# Patient Record
Sex: Male | Born: 1975 | Race: Black or African American | Hispanic: No | Marital: Single | Smoking: Current every day smoker
Health system: Southern US, Community
[De-identification: ages and names within clinical notes are randomized; demographics above are authoritative.]

## PROBLEM LIST (undated history)

## (undated) DIAGNOSIS — M6780 Other specified disorders of synovium and tendon, unspecified site: Secondary | ICD-10-CM

## (undated) DIAGNOSIS — M65811 Other synovitis and tenosynovitis, right shoulder: Secondary | ICD-10-CM

## (undated) DIAGNOSIS — M67911 Unspecified disorder of synovium and tendon, right shoulder: Secondary | ICD-10-CM

## (undated) DIAGNOSIS — J189 Pneumonia, unspecified organism: Secondary | ICD-10-CM

## (undated) HISTORY — PX: APPENDECTOMY: SHX54

---

## 2008-07-15 ENCOUNTER — Emergency Department (HOSPITAL_COMMUNITY): Admission: EM | Admit: 2008-07-15 | Discharge: 2008-07-15 | Payer: Self-pay | Admitting: Emergency Medicine

## 2008-07-16 ENCOUNTER — Ambulatory Visit (HOSPITAL_COMMUNITY): Admission: RE | Admit: 2008-07-16 | Discharge: 2008-07-16 | Payer: Self-pay | Admitting: Emergency Medicine

## 2008-07-17 ENCOUNTER — Emergency Department (HOSPITAL_COMMUNITY): Admission: EM | Admit: 2008-07-17 | Discharge: 2008-07-17 | Payer: Self-pay | Admitting: Emergency Medicine

## 2011-09-12 ENCOUNTER — Emergency Department (INDEPENDENT_AMBULATORY_CARE_PROVIDER_SITE_OTHER): Payer: BC Managed Care – PPO

## 2011-09-12 ENCOUNTER — Emergency Department (INDEPENDENT_AMBULATORY_CARE_PROVIDER_SITE_OTHER)
Admission: EM | Admit: 2011-09-12 | Discharge: 2011-09-12 | Disposition: A | Discharge status: Home / Self Care | Payer: BC Managed Care – PPO | Source: Home / Self Care | Attending: Family Medicine | Admitting: Family Medicine

## 2011-09-12 ENCOUNTER — Encounter (HOSPITAL_COMMUNITY): Payer: Self-pay | Admitting: Emergency Medicine

## 2011-09-12 DIAGNOSIS — M461 Sacroiliitis, not elsewhere classified: Secondary | ICD-10-CM

## 2011-09-12 MED ORDER — PREDNISONE (PAK) 10 MG PO TABS: ORAL_TABLET | ORAL | Status: AC

## 2011-09-12 MED ORDER — HYDROCODONE-ACETAMINOPHEN 5-325 MG PO TABS: ORAL_TABLET | ORAL | Status: AC

## 2011-09-12 NOTE — ED Provider Notes (Signed)
History     CSN: [id number]  Arrival date & time 09/12/11  1530   First MD Initiated Contact with Patient 09/12/11 1605      Chief Complaint  Patient presents with  . Leg Injury  . Hip Pain  . Fall    (Consider location/radiation/quality/duration/timing/severity/associated sxs/prior treatment) HPI Comments: Shawn Todd presents for evaluation of right hip pain. He reports that he stumbled and fell, over a curb about 8 days ago. He reports that he was walking backwards, talking with people. He did not realize the curb was there. He turned and fell onto his right hip and leg. He has been able to ambulate. Since that time he reports intermittent, sharp pains in his right lower back and right lower leg. 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  Patient is a 36 y.o. male presenting with hip pain. The history is provided by the patient.  Hip Pain This is a new problem. The current episode started more than 1 week ago. The problem occurs constantly. The problem has not changed since onset.The symptoms are aggravated by walking. The treatment provided mild relief.    History reviewed. No pertinent past medical history.  Past Surgical History  Procedure Date  . Appendectomy     No family history on file.  History  Substance Use Topics  . Smoking status: Current Everyday Smoker  . Smokeless tobacco: Not on file  . Alcohol Use: Yes      Review of Systems  Constitutional: Negative.   HENT: Negative.   Eyes: Negative.   Respiratory: Negative.   Cardiovascular: Negative.   Gastrointestinal: Negative.   Genitourinary: Negative.   Musculoskeletal: Negative.   Skin: Negative.   Neurological: Negative.     Allergies  Review of patient's allergies indicates no known allergies.  Home Medications   Current Outpatient Rx  Name Route Sig Dispense Refill  . HYDROCODONE-ACETAMINOPHEN 5-325 MG PO  TABS  Take one to two tablets every 4 to 6 hours as needed for pain 20 tablet 0  . PREDNISONE (PAK) 10 MG PO TABS  Take 6 tablets on day 1, 5 tablets on day 2, 4 tablets on day 3, 3 tablets on day 4, 2 tablets on day 5, 1 tablet on day 6 21 tablet 0    BP 130/91  Pulse 58  Temp(Src) 99.2 F (37.3 C) (Oral)  Resp 14  SpO2 100%  Physical Exam  Nursing note and vitals reviewed. Constitutional: He is oriented to person, place, and time. He appears well-developed and well-nourished.  HENT:  Head: Normocephalic and atraumatic.  Eyes: EOM are normal.  Neck: Normal range of motion.  Pulmonary/Chest: Effort normal.  Musculoskeletal: Normal range of motion.       RIGHT hip: full flexion, extension, abduction, adduction against resistance; 5/5 strength; no pain or difficulty with internal or external rotation of hip;   Back: positive straight leg raise RIGHT; negative straight leg on LEFT, positive Fabers on RIGHT, negative Fabers on LEFT; no pain with internal or external rotation bilaterally, full flexion, extension, abduction, and adduction; 5/5 strength  Neurological: He is alert and oriented to person, place, and time.  Skin: Skin is warm and dry.  Psychiatric: His behavior is normal.    ED Course  Procedures (including critical care time)  Labs Reviewed - No data to display Dg Pelvis 1-2 Views  09/12/2011  *RADIOLOGY REPORT*  Clinical Data: Injury.  Hip  pain.  Fall  PELVIS - 1-2 VIEW  Comparison: None.  Findings: There is no evidence of fracture or dislocation.  There is no evidence of arthropathy or other focal bone abnormality. Soft tissues are unremarkable.  IMPRESSION: Negative exam.  Original Report Authenticated By: Rosealee Albee, M.D.   Dg Tibia/fibula Right  09/12/2011  *RADIOLOGY REPORT*  Clinical Data: History of fall complaining of pain in the proximal fibula.  RIGHT TIBIA AND FIBULA - 2 VIEW  Comparison: No priors.  Findings: Multiple views of the right leg demonstrate  mild expansion and trabecular irregularity of the proximal right fibula. No definite acute fractures are identified in the fibula or tibia. Overlying soft tissues are unremarkable.  IMPRESSION: 1.  No acute fracture. 2.  Appearance of proximal fibula is suggestive of either of the post-traumatic remodelling from remote proximal fibular fracture, or a benign process such as fibrous dysplasia.  Original Report Authenticated By: Florencia Reasons, M.D.     1. Sacroiliitis       MDM  Xray's reviewed by radiologist and myself; rx given for prednisone dosepak and hydrocodone PRN        Shawn Munda, MD 09/12/11 [phone]

## 2011-09-12 NOTE — ED Notes (Signed)
PT HERE WITH RIGHT HIP PAIN SHOOTING DOWN TO R MEDIAL LEG INTERMITT WITH CERTAIN MOVEMENT OR POSITION S/P INJURY LAST WEEK.PT STATES HE WAS NOT LOOKING AND TRIPPED OVER CURB WHEN HE FELL ON HIP AND LEG.DENIES BRUISING.ABLE TO AMBULATE WITHOUT DIFF BUT EFFECTING ACTIVITIES.PT TRIED ICY HOT AND ICE/HEAT BUT NO RELIEF

## 2011-09-12 NOTE — Discharge Instructions (Signed)
Take medications as directed. Use mild heat (heating pad, warm baths, etc) for 10 to 15 minutes, two to three times daily, as needed and as tolerated, taking care to not burn the skin. Begin stretches and exercises, as instructed in handouts, after 48 hours. Return to care should your symptoms not improve, or worsen in any way, such as numbness, weakness, or tingling, or inability to control urine or bowel movements.   

## 2011-10-04 ENCOUNTER — Other Ambulatory Visit: Payer: Self-pay | Admitting: Orthopedic Surgery

## 2011-10-04 DIAGNOSIS — M25511 Pain in right shoulder: Secondary | ICD-10-CM

## 2011-10-04 DIAGNOSIS — S43439A Superior glenoid labrum lesion of unspecified shoulder, initial encounter: Secondary | ICD-10-CM

## 2011-10-11 ENCOUNTER — Other Ambulatory Visit: Payer: Self-pay | Admitting: Orthopedic Surgery

## 2011-10-11 DIAGNOSIS — Z139 Encounter for screening, unspecified: Secondary | ICD-10-CM

## 2011-10-13 ENCOUNTER — Inpatient Hospital Stay: Admission: RE | Admit: 2011-10-13 | Payer: BC Managed Care – PPO | Source: Ambulatory Visit

## 2011-10-13 ENCOUNTER — Other Ambulatory Visit: Payer: BC Managed Care – PPO

## 2011-10-27 ENCOUNTER — Ambulatory Visit
Admission: RE | Admit: 2011-10-27 | Discharge: 2011-10-27 | Disposition: A | Discharge status: Home / Self Care | Payer: BC Managed Care – PPO | Source: Ambulatory Visit | Attending: Orthopedic Surgery | Admitting: Orthopedic Surgery

## 2011-10-27 DIAGNOSIS — S43439A Superior glenoid labrum lesion of unspecified shoulder, initial encounter: Secondary | ICD-10-CM

## 2011-10-27 DIAGNOSIS — M25511 Pain in right shoulder: Secondary | ICD-10-CM

## 2011-10-27 DIAGNOSIS — Z139 Encounter for screening, unspecified: Secondary | ICD-10-CM

## 2011-10-27 MED ORDER — IOHEXOL 180 MG/ML  SOLN
15.0000 mL | Freq: Once | INTRAMUSCULAR | Status: AC | PRN
  Administered 2011-10-27: 15 mL via INTRA_ARTICULAR

## 2012-06-07 ENCOUNTER — Encounter (HOSPITAL_COMMUNITY): Payer: Self-pay | Admitting: Emergency Medicine

## 2012-06-07 ENCOUNTER — Emergency Department (INDEPENDENT_AMBULATORY_CARE_PROVIDER_SITE_OTHER)
Admission: EM | Admit: 2012-06-07 | Discharge: 2012-06-07 | Disposition: A | Discharge status: Home / Self Care | Payer: BC Managed Care – PPO | Source: Home / Self Care | Attending: Family Medicine | Admitting: Family Medicine

## 2012-06-07 DIAGNOSIS — M25569 Pain in unspecified knee: Secondary | ICD-10-CM

## 2012-06-07 DIAGNOSIS — M25561 Pain in right knee: Secondary | ICD-10-CM

## 2012-06-07 MED ORDER — IBUPROFEN 800 MG PO TABS: 800.0000 mg | ORAL_TABLET | Freq: Three times a day (TID) | ORAL | Status: DC | PRN

## 2012-06-07 NOTE — ED Provider Notes (Signed)
Medical screening examination/treatment/procedure(s) were performed by resident physician or non-physician practitioner and as supervising physician I was immediately available for consultation/collaboration.   Barkley Bruns MD.    Linna Hoff, MD 06/07/12 [phone]

## 2012-06-07 NOTE — ED Provider Notes (Signed)
History     CSN: [id number]  Arrival date & time 06/07/12  1540   First MD Initiated Contact with Patient 06/07/12 1656      Chief Complaint  Patient presents with  . Knee Pain    (Consider location/radiation/quality/duration/timing/severity/associated sxs/prior treatment) Patient is a 36 y.o. male presenting with knee pain. The history is provided by the patient.  Knee Pain This is a new problem. The current episode started 3 to 5 hours ago. The problem occurs constantly. The problem has not changed since onset.The symptoms are aggravated by walking, bending and twisting. The symptoms are relieved by rest. He has tried nothing for the symptoms. The treatment provided mild relief.    History reviewed. No pertinent past medical history.  Past Surgical History  Procedure Date  . Appendectomy     No family history on file.  History  Substance Use Topics  . Smoking status: Current Every Day Smoker  . Smokeless tobacco: Not on file  . Alcohol Use: Yes      Review of Systems  Musculoskeletal: Positive for arthralgias.  All other systems reviewed and are negative.    Allergies  Review of patient's allergies indicates no known allergies.  Home Medications   Current Outpatient Rx  Name  Route  Sig  Dispense  Refill  . IBUPROFEN 800 MG PO TABS   Oral   Take 1 tablet (800 mg total) by mouth every 8 (eight) hours as needed for pain.   30 tablet   0     BP 138/77  Pulse 74  Temp 97.7 F (36.5 C) (Oral)  Resp 18  SpO2 96%  Physical Exam  Nursing note and vitals reviewed. Constitutional: He is oriented to person, place, and time. Vital signs are normal. He appears well-developed and well-nourished. He is active and cooperative.  HENT:  Head: Normocephalic.  Eyes: Conjunctivae normal are normal. Pupils are equal, round, and reactive to light. No scleral icterus.  Neck: Trachea normal. Neck supple.  Cardiovascular: Normal rate, regular rhythm, normal heart  sounds and intact distal pulses.   Pulmonary/Chest: Effort normal and breath sounds normal.  Musculoskeletal: Normal range of motion.       Right knee: Normal.       Left knee: Normal.       Right ankle: Normal. Achilles tendon normal.       Legs: Neurological: He is alert and oriented to person, place, and time. No cranial nerve deficit or sensory deficit.  Skin: Skin is warm and dry.  Psychiatric: He has a normal mood and affect. His speech is normal and behavior is normal. Judgment and thought content normal. Cognition and memory are normal.    ED Course  Procedures (including critical care time)  Labs Reviewed - No data to display No results found.   1. Right knee pain       MDM  Ice applied today, then switch to heat.  Knee sleeve, nsaids.        Johnsie Kindred, NP 06/07/12 1704

## 2012-06-07 NOTE — ED Notes (Addendum)
Pt c/o right knee pain since... Was getting inside a car when he felt his right knee "pop"... Pain is to medial of knee, hurts to walk and bend knee... Denies: swelling or inj/truama to site... He is alert w/no signs of acute distress and an unsteady gait.

## 2013-12-12 ENCOUNTER — Ambulatory Visit: Payer: Self-pay

## 2014-02-28 ENCOUNTER — Ambulatory Visit (INDEPENDENT_AMBULATORY_CARE_PROVIDER_SITE_OTHER): Payer: BC Managed Care – PPO | Admitting: Family Medicine

## 2014-02-28 VITALS — BP 98/62 | HR 60 | Temp 97.5°F | Resp 20 | Ht 74.0 in | Wt 157.0 lb

## 2014-02-28 DIAGNOSIS — Z5189 Encounter for other specified aftercare: Secondary | ICD-10-CM

## 2014-02-28 DIAGNOSIS — M25519 Pain in unspecified shoulder: Secondary | ICD-10-CM

## 2014-02-28 DIAGNOSIS — M25511 Pain in right shoulder: Secondary | ICD-10-CM

## 2014-02-28 DIAGNOSIS — S43431D Superior glenoid labrum lesion of right shoulder, subsequent encounter: Secondary | ICD-10-CM

## 2014-02-28 NOTE — Patient Instructions (Signed)
Follow up with orthopaedic doctor if symptoms return. Should not lift if pain returns- see letter.  Return to the clinic or go to the nearest emergency room if any of your symptoms worsen or new symptoms occur.

## 2014-02-28 NOTE — Progress Notes (Addendum)
Subjective:  This chart was scribed for Shade Flood, MD by Elveria Rising, Medial Scribe. This patient was seen in room 13 and the patient's care was started at 3:26 PM.    Patient ID: Shawn Todd, male    DOB: 12/09/1975, 38 y.o.   MRN: [id number]  HPI HPI Comments: Shawn Todd is a 38 y.o. male who presents to the [facility] complaining of right shoulder pain. Patient states that he was recommenced for rotator cuff surgery for his right shoulder. MRI of right shoulder in 05/21013: slap lesion and shallow surface tear supraspinatus. He was hesitant about surgery and never followed up for consultation with orthopedist [name]. Patient reports history of bilateral shoulder pain, with greater pain in his right.  Patient reports right shoulder injury two weeks ago while playing basketball. Injury sustained when landing on kid's shoulder on his right axilla when coming down for a rebound. This event aggravated his right shoulder pain. Patient has since been out of work for these last two weeks. He denies evaluation for his shoulder pain during his hiatus. Patient states that he needs verified note to return to work. Patient works at [employer].   Patient reports chronic, baseline level pain before the injury. He reports that he is quite active and he was able to perform his job with tolerable difficulty before the injury. He states that his shoulder pain has returned to typical levels and feels that he is ready to return to work. Patient requests documentation to allow his return to work.   There are no active problems to display for this patient.  History reviewed. No pertinent past medical history. Past Surgical History  Procedure Laterality Date  . Appendectomy     No Known Allergies Prior to Admission medications   Not on File   History   Social History  . Marital Status: Single    Spouse Name: N/A    Number of Children: N/A  . Years of Education: N/A    Occupational History  . Not on file.   Social History Main Topics  . Smoking status: Current Every Day Smoker  . Smokeless tobacco: Never Used  . Alcohol Use: Yes  . Drug Use: No  . Sexual Activity: Not on file   Other Topics Concern  . Not on file   Social History Narrative  . No narrative on file    Review of Systems  Constitutional: Negative for fever and chills.  Musculoskeletal: Positive for arthralgias.  Neurological: Negative for weakness and numbness.       Objective:   Physical Exam  Nursing note and vitals reviewed. Constitutional: He is oriented to person, place, and time. He appears well-developed and well-nourished.  HENT:  Head: Normocephalic and atraumatic.  Eyes: EOM are normal.  Neck: Neck supple.  Cardiovascular: Normal rate.   Pulmonary/Chest: Effort normal.  Musculoskeletal: Normal range of motion.  Right shoulder: Full ROM. Full rotator cuff strength. Negative empty can. Negative lift off. Negative neer. Negative Hawkin's. Positive O'Brien's. [place] and AC joints are nontender.   Neurological: He is alert and oriented to person, place, and time.  Skin: Skin is warm and dry.  Psychiatric: He has a normal mood and affect. His behavior is normal.     Filed Vitals:   02/28/14 1444  BP: 98/62  Pulse: 60  Temp: 97.5 F (36.4 C)  TempSrc: Oral  Resp: 20  Height:  (1.88 m)  Weight: 157 lb (71.215  kg)  SpO2: 100%        Assessment & Plan:  Shawn Todd is a 38 y.o. male SLAP (superior glenoid labrum lesion), right, subsequent encounter  Pain in joint, shoulder region, right  Hx of SLAP lesion and surface tear of supraspinatus, but as above had been working with this without difficulty.  Injury 2 weeks ago, but now subjectively back to baseline. Min discomfort with obriens, but full ROM and RTC strength. Discussed follow up with ortho, but as full strength on exam and back to baseline - note given to RTW. If any recurrence of pain with  work - advised employer and to seek care here or ortho.   No orders of the defined types were placed in this encounter.   Patient Instructions  Follow up with orthopaedic doctor if symptoms return. Should not lift if pain returns- see letter.  Return to the clinic or go to the nearest emergency room if any of your symptoms worsen or new symptoms occur.

## 2014-08-31 ENCOUNTER — Ambulatory Visit (INDEPENDENT_AMBULATORY_CARE_PROVIDER_SITE_OTHER): Payer: BLUE CROSS/BLUE SHIELD

## 2014-08-31 ENCOUNTER — Ambulatory Visit (INDEPENDENT_AMBULATORY_CARE_PROVIDER_SITE_OTHER): Payer: BLUE CROSS/BLUE SHIELD | Admitting: Family Medicine

## 2014-08-31 VITALS — BP 100/62 | HR 72 | Temp 97.8°F | Resp 16 | Ht 75.0 in | Wt 157.2 lb

## 2014-08-31 DIAGNOSIS — M25512 Pain in left shoulder: Secondary | ICD-10-CM

## 2014-08-31 DIAGNOSIS — R202 Paresthesia of skin: Secondary | ICD-10-CM

## 2014-08-31 DIAGNOSIS — M542 Cervicalgia: Secondary | ICD-10-CM

## 2014-08-31 DIAGNOSIS — S46012A Strain of muscle(s) and tendon(s) of the rotator cuff of left shoulder, initial encounter: Secondary | ICD-10-CM | POA: Diagnosis not present

## 2014-08-31 DIAGNOSIS — R200 Anesthesia of skin: Secondary | ICD-10-CM

## 2014-08-31 MED ORDER — CYCLOBENZAPRINE HCL 5 MG PO TABS: 5.0000 mg | ORAL_TABLET | Freq: Three times a day (TID) | ORAL | Status: DC | PRN

## 2014-08-31 MED ORDER — OXYCODONE-ACETAMINOPHEN 5-325 MG PO TABS: 1.0000 | ORAL_TABLET | Freq: Three times a day (TID) | ORAL | Status: DC | PRN

## 2014-08-31 MED ORDER — MELOXICAM 7.5 MG PO TABS: 7.5000 mg | ORAL_TABLET | Freq: Two times a day (BID) | ORAL | Status: DC

## 2014-08-31 NOTE — Progress Notes (Addendum)
Chief Complaint:  Chief Complaint  Patient presents with  . Shoulder Pain    Left Shoulder, x 1 week    HPI: Shawn Todd is a 39 y.o. male who is here for 1 week history of left shoulder pain, he is unsure triggered it. 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  He picks up heavy items, 100 + lbs at work, he has numbness an tingling, no prior traumatic injury to the left shoulder. He has numbness and tingling if he sleeps on it nub all the away down, he has neck pain too. He has it in his trapezius. He is right hand dominant. He was laying on his shoulder when he first noticed sxs. He has no prior shoulder surgeries or injury.  He is a smoker. 7/10 pain, aching throbbing, night pain. He has been on advil pills without any relief. He has had problems with his right rotator cuff in the past and was needing surgery however he did not want to take off work for the surgery. He was being seen at that time by Shawn Todd. He is okay with going back to Shawn Todd and to be evaluated by Shawn Todd for his left shoulder. He has had hydrocodone/oxycodone in the past without any side effects. He denies any issues with substance abuse. He denies having an addictive personality. The patient is a smoker.  IMPRESSION:  1. Type 2 SLAP lesion. 2. Rotator interval synovitis. 3. Moderate supraspinatus tendinopathy/tendinosis. Shallow articular surface tear involving the supraspinatus tendon. 4. Intact biceps tendon, anterior and posterior labra.  History reviewed. No pertinent past medical history. Past Surgical History  Procedure Laterality Date  . Appendectomy     History   Social History  . Marital Status: Single    Spouse Name: N/A  . Number of Children: N/A  . Years of Education: N/A   Social History Main Topics  . Smoking status: Current Every Day Smoker  . Smokeless tobacco: Never Used  . Alcohol Use: Yes  . Drug Use: No  .  Sexual Activity: Not on file   Other Topics Concern  . None   Social History Narrative   Family History  Problem Relation Age of Onset  . Diabetes Father   . Heart disease Father   . Hypertension Father    No Known Allergies Prior to Admission medications   Not on File     ROS: The patient denies fevers, chills, night sweats, unintentional weight loss, chest pain, palpitations, wheezing, dyspnea on exertion, nausea, vomiting, abdominal pain, dysuria, hematuria, melena  All other systems have been reviewed and were otherwise negative with the exception of those mentioned in the HPI and as above.    PHYSICAL EXAM: Filed Vitals:   08/31/14 1115  BP: 100/62  Pulse: 72  Temp: 97.8 F (36.6 C)  Resp: 16   Filed Vitals:   08/31/14 1115  Height: 6\' 3"  (1.905 m)  Weight: 157 lb 3.2 oz (71.305 kg)   Body mass index is 19.65 kg/(m^2).  General: Alert, no acute distress HEENT:  Normocephalic, atraumatic, oropharynx patent. EOMI, PERRLA Cardiovascular:  Regular rate and rhythm, no rubs murmurs or gallops.  No Carotid bruits, radial pulse intact. No pedal edema.  Respiratory: Clear to auscultation bilaterally.  No wheezes, rales, or rhonchi.  No cyanosis, no use of accessory musculature GI: No organomegaly, abdomen is soft and non-tender, positive bowel sounds.  No masses.  Skin: No rashes. Neurologic: Facial musculature symmetric. Psychiatric: Patient is appropriate throughout our interaction. Lymphatic: No cervical lymphadenopathy Musculoskeletal: Gait intact. Decrease ROM, + weakness  Neck exam normal-neg spurling Shoulder No deformity, no hypertrophy/atrophy, no erythema, no fluid, no wounds Nontender at [facility] jt + Empty Can test, UNable to peform Lift off test, Speeds, Hawkins/Neers due to pain  4/5 strength,   LABS: No results found for this or any previous visit.   EKG/XRAY:   Primary read interpreted by Dr. Conley [facility] at [facility]. Neg for fracuter or  dislocation   ASSESSMENT/PLAN: Encounter Diagnoses  Name Primary?  . Neck pain   . Pain in joint, shoulder region, left   . Rotator cuff (capsule) sprain and strain, left, initial encounter Yes  . Numbness and tingling in left arm    Pleasant right hand dominant 39 year old gentleman, with past medical history of right shoulder rotator cuff injury who presents with left shoulder pain for the last 1 week. He has weakness on exam Patient was given a sling. This is for comfort. He was also prescribed Flexeril, Mobic, Percocet. For pain when necessary. Refer to Dr Shawn Todd for further evaluation. Work note given. Follow-up when necessary  Gross sideeffects, risk and benefits, and alternatives of medications d/w patient. Patient is aware that all medications have potential sideeffects and we are unable to predict every sideeffect or drug-drug interaction that may occur.  Shawn Vicars PHUONG, DO 08/31/2014 12:21 PM    09/09/14-came in for a work note extension. No appt with ortho yet. This referral has already been done, I have called the office myself and he has an appointment with Dr Shawn Pont on Friday March 18 at 8:30.

## 2014-08-31 NOTE — Progress Notes (Deleted)
      Chief Complaint:  Chief Complaint  Patient presents with  . Shoulder Pain    Left Shoulder, x 1 week    HPI: Shawn Todd is a 39 y.o. male who is here for  ***  History reviewed. No pertinent past medical history. Past Surgical History  Procedure Laterality Date  . Appendectomy     History   Social History  . Marital Status: Single    Spouse Name: N/A  . Number of Children: N/A  . Years of Education: N/A   Social History Main Topics  . Smoking status: Current Every Day Smoker  . Smokeless tobacco: Never Used  . Alcohol Use: Yes  . Drug Use: No  . Sexual Activity: Not on file   Other Topics Concern  . None   Social History Narrative   Family History  Problem Relation Age of Onset  . Diabetes Father   . Heart disease Father   . Hypertension Father    No Known Allergies Prior to Admission medications   Not on File     ROS: The patient denies fevers, chills, night sweats, unintentional weight loss, chest pain, palpitations, wheezing, dyspnea on exertion, nausea, vomiting, abdominal pain, dysuria, hematuria, melena, numbness, weakness, or tingling. ***  All other systems have been reviewed and were otherwise negative with the exception of those mentioned in the HPI and as above.    PHYSICAL EXAM: Filed Vitals:   08/31/14 1115  BP: 100/62  Pulse: 72  Temp: 97.8 F (36.6 C)  Resp: 16   Filed Vitals:   08/31/14 1115  Height: 6\' 3"  (1.905 m)  Weight: 157 lb 3.2 oz (71.305 kg)   Body mass index is 19.65 kg/(m^2).  General: Alert, no acute distress HEENT:  Normocephalic, atraumatic, oropharynx patent. EOMI, PERRLA Cardiovascular:  Regular rate and rhythm, no rubs murmurs or gallops.  No Carotid bruits, radial pulse intact. No pedal edema.  Respiratory: Clear to auscultation bilaterally.  No wheezes, rales, or rhonchi.  No cyanosis, no use of accessory musculature GI: No organomegaly, abdomen is soft and non-tender, positive bowel sounds.  No  masses. Skin: No rashes. Neurologic: Facial musculature symmetric. Psychiatric: Patient is appropriate throughout our interaction. Lymphatic: No cervical lymphadenopathy Musculoskeletal: Gait intact.   LABS: No results found for this or any previous visit.   EKG/XRAY:   [facility]   ASSESSMENT/PLAN: No diagnosis found.   Gross sideeffects, risk and benefits, and alternatives of medications d/w patient. Patient is aware that all medications have potential sideeffects and we are unable to predict every sideeffect or drug-drug interaction that may occur.  Payden Docter PHUONG, DO 08/31/2014 11:33 AM

## 2014-08-31 NOTE — Patient Instructions (Signed)

## 2014-09-09 ENCOUNTER — Encounter: Payer: Self-pay | Admitting: Family Medicine

## 2014-09-09 ENCOUNTER — Telehealth: Payer: Self-pay

## 2014-09-09 NOTE — Telephone Encounter (Signed)
Pt dropped off [employer] short term disability paperwork on 09/09/14 for Dr. Conley RollsLe to complete. Pt states this must be returned within 48 hours. Please return to disability department upon completion, and [name] or myself will scan into Pt chart,and fax through epic. Release #[id number]

## 2014-09-09 NOTE — Telephone Encounter (Signed)
Can you call him back and tell him our FMLA policy and how long it takes to do it. Thanks

## 2014-09-10 NOTE — Telephone Encounter (Signed)
Yes, I made sure he knew at time of drop off that it is a 5-7 day turn around time, and that we would do our best, but no promises were made.

## 2014-09-11 NOTE — Telephone Encounter (Signed)
Forms filled out and faxed, given to [name] [facility]

## 2014-09-22 ENCOUNTER — Other Ambulatory Visit: Payer: Self-pay | Admitting: Physician Assistant

## 2014-09-25 DIAGNOSIS — M65911 Unspecified synovitis and tenosynovitis, right shoulder: Secondary | ICD-10-CM

## 2014-09-25 DIAGNOSIS — M67911 Unspecified disorder of synovium and tendon, right shoulder: Secondary | ICD-10-CM

## 2014-09-25 DIAGNOSIS — M65811 Other synovitis and tenosynovitis, right shoulder: Secondary | ICD-10-CM

## 2014-09-25 DIAGNOSIS — M6780 Other specified disorders of synovium and tendon, unspecified site: Secondary | ICD-10-CM

## 2014-09-25 HISTORY — DX: Unspecified disorder of synovium and tendon, right shoulder: M67.911

## 2014-09-25 HISTORY — DX: Other synovitis and tenosynovitis, right shoulder: M65.811

## 2014-09-25 HISTORY — DX: Other specified disorders of synovium and tendon, unspecified site: M67.80

## 2014-09-25 HISTORY — DX: Unspecified synovitis and tenosynovitis, right shoulder: M65.911

## 2014-09-28 ENCOUNTER — Encounter (HOSPITAL_BASED_OUTPATIENT_CLINIC_OR_DEPARTMENT_OTHER): Payer: Self-pay | Admitting: *Deleted

## 2014-09-29 NOTE — H&P (Signed)
[facility] [address]   [address] [place], [place] [id number] [phone] A Division of [facility]  Loreta Aveaniel F. Craigory Toste, M.D.   Robert A. Thurston HoleWainer, M.D.   Burnell BlanksW. Dan Caffrey, M.D.   Eulas PostJoshua P. Landau, M.D.   Lunette StandsAnna Voytek, M.D Jewel Baizeimothy D. Eulah PontMurphy, M.D.  Buford DresserWesley R. Ibazebo, M.D.  Estell HarpinJames S. Kramer, M.D.    Melina Fiddlerebecca S. Bassett, M.D. Janalee DaneBrittney Kelly, PA- C  Mary L. Dub MikesStanbery, PA-C  Kirstin A. Shepperson, PA-C  Josh Innsbrookhadwell, PA-C  GoodmanBrandon Parry, [place]-C    RE: Irish LackWalden Iii, [place]   [phone]      DOB: [birth year] PROGRESS NOTE: 09-11-14 This is a 39 year-old active gentleman who works at [employer] who presents to our clinic today with complaints with both shoulders.  We saw Ladale back in 2013 with recurrent pain to the right shoulder.  An MR arthrogram at that point in time was ordered which showed a Type II SLAP tear, as well as marked rotator cuff tendonitis.  Astin was scheduled for surgery, but said he chickened out and was a no show.  We have not seen Jonny RuizJohn since that point in time.  He comes in today for follow up of his right shoulder, as well as new complaints with his left shoulder.  In regards to his right shoulder, it is really not causing him a lot of terrible amount of pain now because he is really not using it.  He still has pain when using the shoulder however.  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  He feels as though his left shoulder is having to overcompensate because of the disuse of the right.  He has been having pain in the left shoulder for the past month or so.  He describes this more anteriorly than anything.  Pain is aggravated with internal rotation, as well as forward flexion and abduction type activities.  He has no history of left shoulder injection or surgical intervention in the past.  No specific injury of note.  Going back to his right shoulder, he has had this injected two times in  the past which has helped significantly.   Past medical, social and family history reviewed in detail on the patient questionnaire and signed.  Review of systems: As detailed in HPI.  All others reviewed and are negative.  EXAMINATION: Well-developed, well-nourished male in no acute distress.  Alert and oriented x 3.  Examination of his right shoulder reveals full active range of motion in all directions.  4/5 strength throughout.  Positive apprehension.  Examination of his left shoulder reveals forward flexion and abduction to about 160 degrees.  He can internally rotate to his back pocket.  Positive empty can.  Positive cross body.  4/5 strength throughout.   X-RAYS: X-rays of bilateral shoulders reveal a Type II acromion.  Moderate AC joint changes.  Fair glenohumeral space.  IMPRESSION: 1. Left shoulder overuse rotator cuff bursitis.   2. Right shoulder Type II SLAP tear.   PLAN: In regards to Geovani's left shoulder, I feel as though proceeding with a 1:4 subacromial injection and Jobe exercises will give him significant relief.  If he is not any better in the next several weeks he will call and we can get an MRI to further delineate the pathology.  In regards to his right shoulder, we are going to proceed with a right shoulder arthroscopic decompression and possible  open tenodesis.  Risks, benefits and possible complications of surgery have been reviewed.  Rehab and recovery time discussed. All questions answered.  Paperwork complete.  We will see Lauris at the time of operative intervention.    Continued  [facility] [address]   [address] [place], [place] [address] [phone] A Division of [facility]  Loreta Ave, M.D.   Robert A. Thurston Hole, M.D.   Burnell Blanks, M.D.   Eulas Post, M.D.   Lunette Stands, M.D Jewel Baize. Eulah Pont, M.D.  Buford Dresser, M.D.  Estell Harpin, M.D.    Melina Fiddler,  M.D. Janalee Dane, PA- C  Mary L. Dub Mikes, PA-C  Kirstin A. Shepperson, PA-C  Josh Wickliffe, PA-C  [place], [place]   RE: Reeves Forth                                [phone]      DOB: 05/09/1923 PROGRESS NOTE: 09-11-14 PROCEDURE NOTE: The patient's clinical condition is marked by substantial pain and/or significant functional disability.  Other conservative therapy has not provided relief, is contraindicated, or not appropriate.  There is a reasonable likelihood that injection will significantly improve the patient's pain and/or functional disability. Patient is seated on the exam table.  The left shoulder is prepped with Betadine and alcohol and injected into the subacromial space with 1:4 Depo-Medrol/Marcaine.  Patient tolerated the procedure without difficulty.   Loreta Ave, M.D.   Electronically verified by Loreta Ave, M.D. DFM(LS):jjh D 09-11-14 T 09-15-14

## 2014-10-01 ENCOUNTER — Ambulatory Visit (HOSPITAL_BASED_OUTPATIENT_CLINIC_OR_DEPARTMENT_OTHER)
Admission: RE | Admit: 2014-10-01 | Discharge: 2014-10-01 | Disposition: A | Discharge status: Home / Self Care | Payer: BLUE CROSS/BLUE SHIELD | Source: Ambulatory Visit | Attending: Orthopedic Surgery | Admitting: Orthopedic Surgery

## 2014-10-01 ENCOUNTER — Encounter (HOSPITAL_BASED_OUTPATIENT_CLINIC_OR_DEPARTMENT_OTHER): Payer: Self-pay | Admitting: *Deleted

## 2014-10-01 ENCOUNTER — Ambulatory Visit (HOSPITAL_BASED_OUTPATIENT_CLINIC_OR_DEPARTMENT_OTHER): Payer: BLUE CROSS/BLUE SHIELD | Admitting: Anesthesiology

## 2014-10-01 ENCOUNTER — Encounter (HOSPITAL_BASED_OUTPATIENT_CLINIC_OR_DEPARTMENT_OTHER)
Admission: RE | Disposition: A | Discharge status: Home / Self Care | Payer: Self-pay | Source: Ambulatory Visit | Attending: Orthopedic Surgery

## 2014-10-01 DIAGNOSIS — M24111 Other articular cartilage disorders, right shoulder: Secondary | ICD-10-CM | POA: Diagnosis not present

## 2014-10-01 DIAGNOSIS — M75101 Unspecified rotator cuff tear or rupture of right shoulder, not specified as traumatic: Secondary | ICD-10-CM | POA: Insufficient documentation

## 2014-10-01 DIAGNOSIS — M7541 Impingement syndrome of right shoulder: Secondary | ICD-10-CM | POA: Insufficient documentation

## 2014-10-01 DIAGNOSIS — M19011 Primary osteoarthritis, right shoulder: Secondary | ICD-10-CM | POA: Diagnosis not present

## 2014-10-01 HISTORY — DX: Other synovitis and tenosynovitis, right shoulder: M65.811

## 2014-10-01 HISTORY — PX: SHOULDER ARTHROSCOPY WITH SUBACROMIAL DECOMPRESSION, ROTATOR CUFF REPAIR AND BICEP TENDON REPAIR: SHX5687

## 2014-10-01 HISTORY — DX: Unspecified disorder of synovium and tendon, right shoulder: M67.911

## 2014-10-01 HISTORY — DX: Other specified disorders of synovium and tendon, unspecified site: M67.80

## 2014-10-01 LAB — POCT HEMOGLOBIN-HEMACUE: Hemoglobin: 15.5 g/dL (ref 13.0–17.0)

## 2014-10-01 SURGERY — SHOULDER ARTHROSCOPY WITH SUBACROMIAL DECOMPRESSION, ROTATOR CUFF REPAIR AND BICEP TENDON REPAIR
Anesthesia: Regional | Site: Shoulder | Laterality: Right

## 2014-10-01 MED ORDER — MIDAZOLAM HCL 2 MG/ML PO SYRP: 12.0000 mg | ORAL_SOLUTION | Freq: Once | ORAL | Status: DC | PRN

## 2014-10-01 MED ORDER — PROPOFOL 10 MG/ML IV BOLUS
INTRAVENOUS | Status: DC | PRN
  Administered 2014-10-01: 200 mg via INTRAVENOUS

## 2014-10-01 MED ORDER — SUCCINYLCHOLINE CHLORIDE 20 MG/ML IJ SOLN
INTRAMUSCULAR | Status: DC | PRN
  Administered 2014-10-01: 100 mg via INTRAVENOUS

## 2014-10-01 MED ORDER — DEXAMETHASONE SODIUM PHOSPHATE 4 MG/ML IJ SOLN
INTRAMUSCULAR | Status: DC | PRN
  Administered 2014-10-01: 10 mg via INTRAVENOUS

## 2014-10-01 MED ORDER — OXYCODONE-ACETAMINOPHEN 5-325 MG PO TABS: 1.0000 | ORAL_TABLET | ORAL | Status: DC | PRN

## 2014-10-01 MED ORDER — SODIUM CHLORIDE 0.9 % IR SOLN
Status: DC | PRN
  Administered 2014-10-01: 9000 mL

## 2014-10-01 MED ORDER — BUPIVACAINE-EPINEPHRINE (PF) 0.5% -1:200000 IJ SOLN
INTRAMUSCULAR | Status: DC | PRN
  Administered 2014-10-01: 30 mL via PERINEURAL

## 2014-10-01 MED ORDER — FENTANYL CITRATE 0.05 MG/ML IJ SOLN
INTRAMUSCULAR | Status: AC
  Filled 2014-10-01: qty 2

## 2014-10-01 MED ORDER — LIDOCAINE HCL (CARDIAC) 20 MG/ML IV SOLN
INTRAVENOUS | Status: DC | PRN
  Administered 2014-10-01: 50 mg via INTRAVENOUS

## 2014-10-01 MED ORDER — ONDANSETRON HCL 4 MG/2ML IJ SOLN: 4.0000 mg | Freq: Four times a day (QID) | INTRAMUSCULAR | Status: DC | PRN

## 2014-10-01 MED ORDER — ONDANSETRON HCL 4 MG PO TABS: 4.0000 mg | ORAL_TABLET | Freq: Four times a day (QID) | ORAL | Status: DC | PRN

## 2014-10-01 MED ORDER — MIDAZOLAM HCL 2 MG/2ML IJ SOLN
1.0000 mg | INTRAMUSCULAR | Status: DC | PRN
  Administered 2014-10-01: 2 mg via INTRAVENOUS

## 2014-10-01 MED ORDER — METHOCARBAMOL 1000 MG/10ML IJ SOLN: 500.0000 mg | Freq: Four times a day (QID) | INTRAVENOUS | Status: DC | PRN

## 2014-10-01 MED ORDER — LACTATED RINGERS IV SOLN: INTRAVENOUS | Status: DC

## 2014-10-01 MED ORDER — METHOCARBAMOL 500 MG PO TABS: 500.0000 mg | ORAL_TABLET | Freq: Four times a day (QID) | ORAL | Status: DC

## 2014-10-01 MED ORDER — FENTANYL CITRATE 0.05 MG/ML IJ SOLN
50.0000 ug | INTRAMUSCULAR | Status: DC | PRN
  Administered 2014-10-01: 100 ug via INTRAVENOUS

## 2014-10-01 MED ORDER — ONDANSETRON HCL 4 MG/2ML IJ SOLN
INTRAMUSCULAR | Status: DC | PRN
  Administered 2014-10-01: 4 mg via INTRAVENOUS

## 2014-10-01 MED ORDER — METHOCARBAMOL 500 MG PO TABS: 500.0000 mg | ORAL_TABLET | Freq: Four times a day (QID) | ORAL | Status: DC | PRN

## 2014-10-01 MED ORDER — MIDAZOLAM HCL 2 MG/2ML IJ SOLN
INTRAMUSCULAR | Status: AC
  Filled 2014-10-01: qty 2

## 2014-10-01 MED ORDER — CHLORHEXIDINE GLUCONATE 4 % EX LIQD: 60.0000 mL | Freq: Once | CUTANEOUS | Status: DC

## 2014-10-01 MED ORDER — ONDANSETRON HCL 4 MG PO TABS: 4.0000 mg | ORAL_TABLET | Freq: Three times a day (TID) | ORAL | Status: DC | PRN

## 2014-10-01 MED ORDER — METOCLOPRAMIDE HCL 5 MG PO TABS: 5.0000 mg | ORAL_TABLET | Freq: Three times a day (TID) | ORAL | Status: DC | PRN

## 2014-10-01 MED ORDER — FENTANYL CITRATE 0.05 MG/ML IJ SOLN
INTRAMUSCULAR | Status: DC | PRN
  Administered 2014-10-01: 25 ug via INTRAVENOUS

## 2014-10-01 MED ORDER — METOCLOPRAMIDE HCL 5 MG/ML IJ SOLN: 5.0000 mg | Freq: Three times a day (TID) | INTRAMUSCULAR | Status: DC | PRN

## 2014-10-01 MED ORDER — CEFAZOLIN SODIUM-DEXTROSE 2-3 GM-% IV SOLR
2.0000 g | INTRAVENOUS | Status: AC
  Administered 2014-10-01: 2 g via INTRAVENOUS

## 2014-10-01 MED ORDER — HYDROMORPHONE HCL 1 MG/ML IJ SOLN: 0.2500 mg | INTRAMUSCULAR | Status: DC | PRN

## 2014-10-01 MED ORDER — CEFAZOLIN SODIUM-DEXTROSE 2-3 GM-% IV SOLR
INTRAVENOUS | Status: AC
  Filled 2014-10-01: qty 50

## 2014-10-01 MED ORDER — HYDROMORPHONE HCL 1 MG/ML IJ SOLN: 0.5000 mg | INTRAMUSCULAR | Status: DC | PRN

## 2014-10-01 MED ORDER — LACTATED RINGERS IV SOLN
INTRAVENOUS | Status: DC
  Administered 2014-10-01: 07:00:00 via INTRAVENOUS

## 2014-10-01 SURGICAL SUPPLY — 75 items
BENZOIN TINCTURE PRP APPL 2/3 (GAUZE/BANDAGES/DRESSINGS) ×3 IMPLANT
BIT DRILL 3/32DIAX5INL DISPOSE (BIT) ×1 IMPLANT
BIT DRILL 3/32DX5IN DISP (BIT) ×1
BLADE CUTTER GATOR 3.5 (BLADE) ×3 IMPLANT
BLADE CUTTER MENIS 5.5 (BLADE) IMPLANT
BLADE GREAT WHITE 4.2 (BLADE) ×2 IMPLANT
BLADE GREAT WHITE 4.2MM (BLADE) ×1
BLADE SURG 15 STRL LF DISP TIS (BLADE) ×1 IMPLANT
BLADE SURG 15 STRL SS (BLADE) ×2
BUR OVAL 6.0 (BURR) ×3 IMPLANT
CANNULA DRY DOC 8X75 (CANNULA) IMPLANT
CANNULA TWIST IN 8.25X7CM (CANNULA) IMPLANT
CLOSURE WOUND 1/2 X4 (GAUZE/BANDAGES/DRESSINGS) ×1
DECANTER SPIKE VIAL GLASS SM (MISCELLANEOUS) IMPLANT
DRAPE STERI 35X30 U-POUCH (DRAPES) ×3 IMPLANT
DRAPE U-SHAPE 47X51 STRL (DRAPES) ×3 IMPLANT
DRAPE U-SHAPE 76X120 STRL (DRAPES) ×6 IMPLANT
DRILL BIT 3/32DIAX5INL DISPOSE (BIT) ×2
DRSG PAD ABDOMINAL 8X10 ST (GAUZE/BANDAGES/DRESSINGS) ×3 IMPLANT
DURAPREP 26ML APPLICATOR (WOUND CARE) ×3 IMPLANT
ELECT MENISCUS 165MM 90D (ELECTRODE) ×3 IMPLANT
ELECT REM PT RETURN 9FT ADLT (ELECTROSURGICAL) ×3
ELECTRODE REM PT RTRN 9FT ADLT (ELECTROSURGICAL) ×1 IMPLANT
GAUZE SPONGE 4X4 12PLY STRL (GAUZE/BANDAGES/DRESSINGS) ×3 IMPLANT
GAUZE XEROFORM 1X8 LF (GAUZE/BANDAGES/DRESSINGS) ×3 IMPLANT
GLOVE BIOGEL PI IND STRL 7.0 (GLOVE) ×3 IMPLANT
GLOVE BIOGEL PI INDICATOR 7.0 (GLOVE) ×6
GLOVE ECLIPSE 6.5 STRL STRAW (GLOVE) ×3 IMPLANT
GLOVE ECLIPSE 7.0 STRL STRAW (GLOVE) ×3 IMPLANT
GLOVE ORTHO TXT STRL SZ7.5 (GLOVE) IMPLANT
GLOVE SURG ORTHO 8.0 STRL STRW (GLOVE) ×3 IMPLANT
GOWN STRL REUS W/ TWL LRG LVL3 (GOWN DISPOSABLE) ×2 IMPLANT
GOWN STRL REUS W/ TWL XL LVL3 (GOWN DISPOSABLE) ×1 IMPLANT
GOWN STRL REUS W/TWL LRG LVL3 (GOWN DISPOSABLE) ×4
GOWN STRL REUS W/TWL XL LVL3 (GOWN DISPOSABLE) ×2
IV NS IRRIG 3000ML ARTHROMATIC (IV SOLUTION) ×9 IMPLANT
MANIFOLD NEPTUNE II (INSTRUMENTS) ×3 IMPLANT
NDL SUT 6 .5 CRC .975X.05 MAYO (NEEDLE) IMPLANT
NEEDLE MAYO TAPER (NEEDLE)
NEEDLE SCORPION MULTI FIRE (NEEDLE) IMPLANT
NS IRRIG 1000ML POUR BTL (IV SOLUTION) IMPLANT
PACK ARTHROSCOPY DSU (CUSTOM PROCEDURE TRAY) ×3 IMPLANT
PACK BASIN DAY SURGERY FS (CUSTOM PROCEDURE TRAY) ×3 IMPLANT
PASSER SUT SWANSON 36MM LOOP (INSTRUMENTS) ×6 IMPLANT
PENCIL BUTTON HOLSTER BLD 10FT (ELECTRODE) ×3 IMPLANT
SET ARTHROSCOPY TUBING (MISCELLANEOUS) ×2
SET ARTHROSCOPY TUBING LN (MISCELLANEOUS) ×1 IMPLANT
SLEEVE SCD COMPRESS KNEE MED (MISCELLANEOUS) ×3 IMPLANT
SLING ARM IMMOBILIZER LRG (SOFTGOODS) IMPLANT
SLING ARM IMMOBILIZER MED (SOFTGOODS) IMPLANT
SLING ARM IMMOBILIZER XL (CAST SUPPLIES) ×3 IMPLANT
SLING ARM LRG ADULT FOAM STRAP (SOFTGOODS) IMPLANT
SLING ARM MED ADULT FOAM STRAP (SOFTGOODS) IMPLANT
SLING ARM XL FOAM STRAP (SOFTGOODS) IMPLANT
SPONGE LAP 4X18 X RAY DECT (DISPOSABLE) IMPLANT
STRIP CLOSURE SKIN 1/2X4 (GAUZE/BANDAGES/DRESSINGS) ×2 IMPLANT
SUCTION FRAZIER TIP 10 FR DISP (SUCTIONS) ×3 IMPLANT
SUT ETHIBOND 2 OS 4 DA (SUTURE) IMPLANT
SUT ETHILON 2 0 FS 18 (SUTURE) IMPLANT
SUT ETHILON 3 0 PS 1 (SUTURE) ×3 IMPLANT
SUT FIBERWIRE #2 38 T-5 BLUE (SUTURE) ×3
SUT MNCRL AB 4-0 PS2 18 (SUTURE) ×3 IMPLANT
SUT RETRIEVER MED (INSTRUMENTS) IMPLANT
SUT TIGER TAPE 7 IN WHITE (SUTURE) IMPLANT
SUT VIC AB 0 CT1 27 (SUTURE) ×2
SUT VIC AB 0 CT1 27XBRD ANBCTR (SUTURE) ×1 IMPLANT
SUT VIC AB 2-0 SH 27 (SUTURE) ×2
SUT VIC AB 2-0 SH 27XBRD (SUTURE) ×1 IMPLANT
SUT VIC AB 3-0 FS2 27 (SUTURE) IMPLANT
SUTURE FIBERWR #2 38 T-5 BLUE (SUTURE) ×1 IMPLANT
TAPE FIBER 2MM 7IN #2 BLUE (SUTURE) IMPLANT
TOWEL OR 17X24 6PK STRL BLUE (TOWEL DISPOSABLE) ×3 IMPLANT
TOWEL OR NON WOVEN STRL DISP B (DISPOSABLE) ×3 IMPLANT
WATER STERILE IRR 1000ML POUR (IV SOLUTION) ×3 IMPLANT
YANKAUER SUCT BULB TIP NO VENT (SUCTIONS) ×3 IMPLANT

## 2014-10-01 NOTE — Anesthesia Preprocedure Evaluation (Addendum)
Anesthesia Evaluation  Patient identified by MRN, date of birth, ID band Patient awake    Reviewed: Allergy & Precautions, H&P , NPO status , Patient's Chart, lab work & pertinent test results  Airway Mallampati: I TM Distance: >3 FB Neck ROM: Full    Dental no notable dental hx. (+) Teeth Intact, Dental Advisory Given   Pulmonary Current Smoker,  breath sounds clear to auscultation  Pulmonary exam normal       Cardiovascular negative cardio ROS  Rhythm:Regular Rate:Normal     Neuro/Psych negative neurological ROS  negative psych ROS   GI/Hepatic negative GI ROS, Neg liver ROS,   Endo/Other  negative endocrine ROS  Renal/GU negative Renal ROS  negative genitourinary   Musculoskeletal   Abdominal   Peds  Hematology negative hematology ROS (+)   Anesthesia Other Findings   Reproductive/Obstetrics negative OB ROS                           Anesthesia Physical Anesthesia Plan  ASA: II  Anesthesia Plan: General and Regional   Post-op Pain Management:    Induction: Intravenous  Airway Management Planned: Oral ETT  Additional Equipment:   Intra-op Plan:   Post-operative Plan: Extubation in OR  Informed Consent: I have reviewed the patients History and Physical, chart, labs and discussed the procedure including the risks, benefits and alternatives for the proposed anesthesia with the patient or authorized representative who has indicated his/her understanding and acceptance.   Dental advisory given  Plan Discussed with: CRNA  Anesthesia Plan Comments:         Anesthesia Quick Evaluation  

## 2014-10-01 NOTE — Progress Notes (Signed)
Assisted Dr. Edmond Fitzgerald with right, ultrasound guided, interscalene  block. Side rails up, monitors on throughout procedure. See vital signs in flow sheet. Tolerated Procedure well. 

## 2014-10-01 NOTE — Transfer of Care (Signed)
Immediate Anesthesia Transfer of Care Note  Patient: Susette RacerJohn Walden III  Procedure(s) Performed: Procedure(s): RIGHT SHOULDER ARTHROSCOPY DEBRIDEMENT,  ACROMIOPLASTY,  DISTAL CLAVICAL EXCISION, OPEN  BICEPS TENODESIS,SUBACROMIAL DECOMPRESSION,DEBRIDE LABRUM (Right)  Patient Location: PACU  Anesthesia Type:General  Level of Consciousness: awake and sedated  Airway & Oxygen Therapy: Patient Spontanous Breathing and Patient connected to face mask oxygen  Post-op Assessment: Report given to RN and Post -op Vital signs reviewed and stable  Post vital signs: Reviewed and stable  Last Vitals:  Filed Vitals:   10/01/14 0735  BP:   Pulse:   Temp:   Resp: 16    Complications: No apparent anesthesia complications

## 2014-10-01 NOTE — Interval H&P Note (Signed)
History and Physical Interval Note:  10/01/2014 7:42 AM  Shawn Todd  has presented today for surgery, with the diagnosis of Rotator interval synovitis Moderate supraspinatus tendinopathy tendinosis right shoulder  The various methods of treatment have been discussed with the patient and family. After consideration of risks, benefits and other options for treatment, the patient has consented to  Procedure(s): RIGHT SHOULDER ARTHROSCOPY DEBRIDEMENT,  ACROMIOPLASTY,  DISTAL CLAVICAL EXCISION, BICEPS TENODESIS, POSSIBLE OPEN BICEPS TENODESIS  (Right) as a surgical intervention .  The patient's history has been reviewed, patient examined, no change in status, stable for surgery.  I have reviewed the patient's chart and labs.  Questions were answered to the patient's satisfaction.     Girlie Veltri F

## 2014-10-01 NOTE — Anesthesia Procedure Notes (Addendum)
Anesthesia Regional Block:  Interscalene brachial plexus block  Pre-Anesthetic Checklist: ,, timeout performed, Correct Patient, Correct Site, Correct Laterality, Correct Procedure, Correct Position, site marked, Risks and benefits discussed, pre-op evaluation,  At surgeon's request and post-op pain management  Laterality: Right  Prep: Maximum Sterile Barrier Precautions used and chloraprep       Needles:  Injection technique: Single-shot  Needle Type: Echogenic Stimulator Needle     Needle Length: 5cm 5 cm Needle Gauge: 22 and 22 G    Additional Needles:  Procedures: ultrasound guided (picture in chart) and nerve stimulator Interscalene brachial plexus block  Nerve Stimulator or Paresthesia:  Response: Biceps response,   Additional Responses:   Narrative:  Start time: 10/01/2014 6:54 AM End time: 10/01/2014 7:03 AM Injection made incrementally with aspirations every 5 mL. Anesthesiologist: Gaynelle AduFITZGERALD, WILLIAM  Additional Notes: 2% Lidocaine skin wheel.    Procedure Name: Intubation Performed by: York GricePEARSON, Jakki Doughty W Pre-anesthesia Checklist: Patient identified, Timeout performed, Emergency Drugs available, Suction available and Patient being monitored Patient Re-evaluated:Patient Re-evaluated prior to inductionOxygen Delivery Method: Circle system utilized Preoxygenation: Pre-oxygenation with 100% oxygen Intubation Type: IV induction Ventilation: Mask ventilation without difficulty Laryngoscope Size: Miller and 2 Grade View: Grade I Tube type: Oral Tube size: 7.0 mm Number of attempts: 1 Airway Equipment and Method: Stylet Placement Confirmation: ETT inserted through vocal cords under direct vision,  breath sounds checked- equal and bilateral and positive ETCO2 Secured at: 22 cm Tube secured with: Tape Dental Injury: Teeth and Oropharynx as per pre-operative assessment

## 2014-10-01 NOTE — Discharge Instructions (Signed)
Shouder arthroscopy, subacromial decompression and open biceps tenodesis Care After Instructions Refer to this sheet in the next few weeks. These discharge instructions provide you with general information on caring for yourself after you leave the hospital. Your caregiver may also give you specific instructions. Your treatment has been planned according to the most current medical practices available, but unavoidable complications sometimes occur. If you have any problems or questions after discharge, please call your caregiver. HOME INSTRUCTIONS You may resume a normal diet and activities as directed.  Take showers instead of baths until informed otherwise.  Change bandages (dressings) in 3 days.  Swab wounds daily with betadine.  Wash shoulder with soap and water.  Pat dry.   Only take over-the-counter or prescription medicines for pain, discomfort, or fever as directed by your caregiver.  Wear your sling for the next 6 weeks unless otherwise instructed. Eat a well-balanced diet.  Avoid lifting or driving until you are instructed otherwise.  Make an appointment to see your caregiver for stitches (suture) or staple removal one week after surgery.   SEEK MEDICAL CARE IF: You have swelling of your calf or leg.  You develop shortness of breath or chest pain.  You have redness, swelling, or increasing pain in the wound.  There is pus or any unusual drainage coming from the surgical site.  You notice a bad smell coming from the surgical site or dressing.  The surgical site breaks open after sutures or staples have been removed.  There is persistent bleeding from the suture or staple line.  You are getting worse or are not improving.  You have any other questions or concerns.  SEEK IMMEDIATE MEDICAL CARE IF:  You have a fever greater than 101 You develop a rash.  You have difficulty breathing.  You develop any reaction or side effects to medicines given.  Your knee motion is decreasing rather  than improving.  MAKE SURE YOU:  Understand these instructions.  Will watch your condition.  Will get help right away if you are not doing well or get worse.    Post Anesthesia Home Care Instructions  Activity: Get plenty of rest for the remainder of the day. A responsible adult should stay with you for 24 hours following the procedure.  For the next 24 hours, DO NOT: -Drive a car -Advertising copywriterperate machinery -Drink alcoholic beverages -Take any medication unless instructed by your physician -Make any legal decisions or sign important papers.  Meals: Start with liquid foods such as gelatin or soup. Progress to regular foods as tolerated. Avoid greasy, spicy, heavy foods. If nausea and/or vomiting occur, drink only clear liquids until the nausea and/or vomiting subsides. Call your physician if vomiting continues.  Special Instructions/Symptoms: Your throat may feel dry or sore from the anesthesia or the breathing tube placed in your throat during surgery. If this causes discomfort, gargle with warm salt water. The discomfort should disappear within 24 hours.  If you had a scopolamine patch placed behind your ear for the management of post- operative nausea and/or vomiting:  1. The medication in the patch is effective for 72 hours, after which it should be removed.  Wrap patch in a tissue and discard in the trash. Wash hands thoroughly with soap and water. 2. You may remove the patch earlier than 72 hours if you experience unpleasant side effects which may include dry mouth, dizziness or visual disturbances. 3. Avoid touching the patch. Wash your hands with soap and water after contact with the patch.  Regional Anesthesia Blocks  1. Numbness or the inability to move the "blocked" extremity may last from 3-48 hours after placement. The length of time depends on the medication injected and your individual response to the medication. If the numbness is not going away after 48 hours, call your  surgeon.  2. The extremity that is blocked will need to be protected until the numbness is gone and the  Strength has returned. Because you cannot feel it, you will need to take extra care to avoid injury. Because it may be weak, you may have difficulty moving it or using it. You may not know what position it is in without looking at it while the block is in effect.  3. For blocks in the legs and feet, returning to weight bearing and walking needs to be done carefully. You will need to wait until the numbness is entirely gone and the strength has returned. You should be able to move your leg and foot normally before you try and bear weight or walk. You will need someone to be with you when you first try to ensure you do not fall and possibly risk injury.  4. Bruising and tenderness at the needle site are common side effects and will resolve in a few days.  5. Persistent numbness or new problems with movement should be communicated to the surgeon or the [facility] [phone] [facility] [phone]).

## 2014-10-01 NOTE — Anesthesia Postprocedure Evaluation (Signed)
  Anesthesia Post-op Note  Patient: Shawn RacerJohn Walden Todd  Procedure(s) Performed: Procedure(s): RIGHT SHOULDER ARTHROSCOPY DEBRIDEMENT,  ACROMIOPLASTY,  DISTAL CLAVICAL EXCISION, OPEN  BICEPS TENODESIS,SUBACROMIAL DECOMPRESSION,DEBRIDE LABRUM (Right)  Patient Location: PACU  Anesthesia Type:General and block  Level of Consciousness: awake and alert   Airway and Oxygen Therapy: Patient Spontanous Breathing  Post-op Pain: none  Post-op Assessment: Post-op Vital signs reviewed, Patient's Cardiovascular Status Stable and Respiratory Function Stable  Post-op Vital Signs: Reviewed  Filed Vitals:   10/01/14 1000  BP: 121/81  Pulse: 66  Temp: 36.4 C  Resp: 16    Complications: No apparent anesthesia complications

## 2014-10-02 ENCOUNTER — Encounter (HOSPITAL_BASED_OUTPATIENT_CLINIC_OR_DEPARTMENT_OTHER): Payer: Self-pay | Admitting: Orthopedic Surgery

## 2014-10-02 NOTE — Op Note (Signed)
NAME:  Shawn Todd, Shawn Todd NO.:  [id number]  MEDICAL RECORD NO.:  [id number]  LOCATION:                                 FACILITY:  PHYSICIAN:  Loreta Ave, M.D. DATE OF BIRTH:  1975-07-09  DATE OF PROCEDURE:  10/01/2014 DATE OF DISCHARGE:  10/01/2014                              OPERATIVE REPORT   PREOPERATIVE DIAGNOSES:  Right shoulder longstanding impingement, rotator cuff partial tear.  Superior labrum anterior and posterior tear with involvement of long head biceps tendon.  Impingement.  Degenerative joint disease, acromioclavicular joint.  POSTOPERATIVE DIAGNOSES:  Right shoulder longstanding impingement, rotator cuff partial tear.  Superior labrum anterior and posterior tear with involvement of long head biceps tendon.  Impingement.  Degenerative joint disease, acromioclavicular joint with marked tearing superior labrum into the biceps tendon origin.  Partial thickness tear rotator cuff supraspinatus from the bottom, nothing full-thickness.  OPERATIVE PROCEDURE:  Right shoulder exam under anesthesia, arthroscopy. Resection intra-articular portion biceps tendon later treated with open tenodesis into a docking tunnel in the bicipital groove on the humerus. Debridement of labrum.  Debridement of rotator cuff above and below. Bursectomy, acromioplasty, CA ligament release.  Excision of distal clavicle.  SURGEON:  Loreta Ave, M.D.  ASSISTANT:  Mikey Kirschner, PA who was present throughout the entire case and necessary for timely completion of procedure.  ANESTHESIA:  General.  BLOOD LOSS:  Minimal.  SPECIMENS:  None.  CULTURES:  None.  COMPLICATIONS:  None.  DRESSINGS:  Soft compressive with shoulder immobilizer.  PROCEDURE:  The patient was brought to the operating room, placed on the operating table in a supine position.  After adequate anesthesia had been obtained, shoulder examined.  Full motion and stable shoulder. Placed in a  beach-chair position on the shoulder positioner, prepped and draped in usual sterile fashion.  Three portals anterior, posterior, and lateral.  Arthroscope introduced, shoulder distended, and inspected. Marked tearing of the biceps into the superior labrum.  Nothing reparable.  Labrum debrided.  Intra-articular portion of biceps released resected allowing it to recess out the shoulder.  Partial thickness tearing on the bottom of the cuff debrided.  Nothing full-thickness. Articular cartilage otherwise looked good.  Instruments were fully removed.  A small longitudinal incision over the bicipital groove, anterior border of the deltoid.  Blunt dissection used to expose the bicipital groove.  Biceps tendon brought out, cut to appropriate length, and captured with FiberWire.  Docking tunnel was made into the humerus, sutures were brought in that larger tunnel and out 2 small exiting tunnels.  Tendon advanced into the docking tunnel with sutures tied over the top for tenodesis.  Wound irrigated.  Closed subcutaneous subcuticular Vicryl.  Arthroscope reintroduced subacromially.  Reactive bursitis debrided.  Acromioplasty, type 2 to type 1 acromion releasing CA ligament.  Grade 4 and 3 changes in [facility] joint.  Periarticular spurs lateral centimeter of clavicle resected.  I then thoroughly looked to the top of the cuff again confirming no further repair was necessary. Instruments were fully removed.  Portals were closed with nylon. Sterile compressive dressing applied.  Shoulder immobilizer applied. Anesthesia reversed.  Brought to the recovery room.  Tolerated the surgery well.  No complications.     Loreta Aveaniel F. Rohen Kimes, M.D.     DFM/MEDQ  D:  10/01/2014  T:  10/02/2014  Job:  [id number]

## 2015-08-23 ENCOUNTER — Ambulatory Visit: Payer: Self-pay

## 2015-08-23 ENCOUNTER — Other Ambulatory Visit: Payer: Self-pay | Admitting: Family Medicine

## 2015-08-23 DIAGNOSIS — M25512 Pain in left shoulder: Secondary | ICD-10-CM

## 2015-11-10 ENCOUNTER — Emergency Department (HOSPITAL_COMMUNITY): Payer: BLUE CROSS/BLUE SHIELD

## 2015-11-10 ENCOUNTER — Encounter (HOSPITAL_COMMUNITY): Payer: Self-pay

## 2015-11-10 ENCOUNTER — Emergency Department (HOSPITAL_COMMUNITY)
Admission: EM | Admit: 2015-11-10 | Discharge: 2015-11-10 | Disposition: A | Discharge status: Home / Self Care | Payer: BLUE CROSS/BLUE SHIELD | Attending: Emergency Medicine | Admitting: Emergency Medicine

## 2015-11-10 DIAGNOSIS — Z79899 Other long term (current) drug therapy: Secondary | ICD-10-CM | POA: Diagnosis not present

## 2015-11-10 DIAGNOSIS — Z791 Long term (current) use of non-steroidal anti-inflammatories (NSAID): Secondary | ICD-10-CM | POA: Insufficient documentation

## 2015-11-10 DIAGNOSIS — J069 Acute upper respiratory infection, unspecified: Secondary | ICD-10-CM | POA: Diagnosis present

## 2015-11-10 DIAGNOSIS — Z79891 Long term (current) use of opiate analgesic: Secondary | ICD-10-CM | POA: Diagnosis not present

## 2015-11-10 DIAGNOSIS — Z792 Long term (current) use of antibiotics: Secondary | ICD-10-CM | POA: Diagnosis not present

## 2015-11-10 DIAGNOSIS — J189 Pneumonia, unspecified organism: Secondary | ICD-10-CM

## 2015-11-10 DIAGNOSIS — J181 Lobar pneumonia, unspecified organism: Secondary | ICD-10-CM | POA: Diagnosis not present

## 2015-11-10 DIAGNOSIS — F17210 Nicotine dependence, cigarettes, uncomplicated: Secondary | ICD-10-CM | POA: Insufficient documentation

## 2015-11-10 MED ORDER — AZITHROMYCIN 250 MG PO TABS: ORAL_TABLET | ORAL | Status: DC

## 2015-11-10 MED ORDER — AMOXICILLIN 500 MG PO CAPS: 500.0000 mg | ORAL_CAPSULE | Freq: Three times a day (TID) | ORAL | Status: DC

## 2015-11-10 NOTE — Discharge Instructions (Signed)

## 2015-11-10 NOTE — ED Provider Notes (Signed)
CSN: [id number]     Arrival date & time 11/10/15  0915 History   First MD Initiated Contact with Patient 11/10/15 0945     Chief Complaint  Patient presents with  . URI   Patient is a 40 y.o. male presenting with URI. The history is provided by the patient.  URI Presenting symptoms: congestion and cough   Severity:  Moderate Onset quality:  Gradual Duration:  1 week Timing:  Constant Chronicity:  New Relieved by:  Nothing Ineffective treatments:  OTC medications Risk factors: no chronic respiratory disease, no recent illness and no recent travel     Past Medical History  Diagnosis Date  . 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 Synovitis of right shoulder 09/2014  . Tendinosis 09/2014    right shoulder   Past Surgical History  Procedure Laterality Date  . Appendectomy    . Shoulder arthroscopy with subacromial decompression, rotator cuff repair and bicep tendon repair Right 10/01/2014    Procedure: RIGHT SHOULDER ARTHROSCOPY DEBRIDEMENT,  ACROMIOPLASTY,  DISTAL CLAVICAL EXCISION, OPEN  BICEPS TENODESIS,SUBACROMIAL DECOMPRESSION,DEBRIDE LABRUM;  Surgeon: Mckinley Jewel, MD;  Location: [facility];  Service: Orthopedics;  Laterality: Right;   Family History  Problem Relation Age of Onset  . Diabetes Father   . Heart disease Father   . Hypertension Father    Social History  Substance Use Topics  . Smoking status: Current Every Day Smoker -- 0.50 packs/day for 10 years    Types: Cigarettes  . Smokeless tobacco: Never Used  . Alcohol Use: Yes     Comment: 2-3 x/week    Review of Systems  HENT: Positive for congestion.   Respiratory: Positive for cough.   All other systems reviewed and are negative.     Allergies  Review of patient's allergies indicates no known allergies.  Home Medications   Prior to Admission medications   Medication Sig Start Date End Date Taking? Authorizing Provider  BLACK CURRANT SEED OIL PO Take 1 tablet by  mouth daily.   Yes Historical Provider, MD  guaiFENesin (MUCINEX) 600 MG 12 hr tablet Take 600 mg by mouth 2 (two) times daily as needed.   Yes Historical Provider, MD  Multiple Vitamin (MULTIVITAMIN WITH MINERALS) TABS tablet Take 1 tablet by mouth daily.   Yes Historical Provider, MD  naproxen (NAPROSYN) 500 MG tablet Take 500 mg by mouth 2 (two) times daily as needed for moderate pain.   Yes Historical Provider, MD  Omega-3 Fatty Acids (OMEGA-3 FISH OIL PO) Take 1 capsule by mouth daily.    Yes Historical Provider, MD  Pseudoephedrine-APAP-DM (DAYQUIL MULTI-SYMPTOM PO) Take 30 mLs by mouth daily as needed (cold symptoms).   Yes Historical Provider, MD  amoxicillin (AMOXIL) 500 MG capsule Take 1 capsule (500 mg total) by mouth 3 (three) times daily. 11/10/15   Linwood Dibbles, MD  azithromycin (ZITHROMAX Z-PAK) 250 MG tablet Take 2 tabs by mouth on day 1 then 1 tab by mouth daily on days 2-5 11/10/15   Linwood Dibbles, MD  meloxicam (MOBIC) 7.5 MG tablet Take 1 tablet (7.5 mg total) by mouth 2 (two) times daily with a meal. NO other NSAIDS, max of 2 pilsl per day Patient not taking: Reported on 11/10/2015 08/31/14   Thao P Le, DO  methocarbamol (ROBAXIN) 500 MG tablet Take 1 tablet (500 mg total) by mouth 4 (four) times daily. Patient not taking: Reported on 11/10/2015 10/01/14   Cristie Hem, PA-C  ondansetron (  ZOFRAN) 4 MG tablet Take 1 tablet (4 mg total) by mouth every 8 (eight) hours as needed for nausea or vomiting. Patient not taking: Reported on 11/10/2015 10/01/14   Cristie HemMary L Stanbery, PA-C  oxyCODONE-acetaminophen (ROXICET) 5-325 MG per tablet Take 1-2 tablets by mouth every 4 (four) hours as needed. Patient not taking: Reported on 11/10/2015 10/01/14   Cristie HemMary L Stanbery, PA-C   BP 125/70 mmHg  Pulse 78  Temp(Src) 98.3 F (36.8 C) (Oral)  Resp 18  SpO2 100% Physical Exam  Constitutional: He appears well-developed and well-nourished. No distress.  HENT:  Head: Normocephalic and atraumatic.  Right Ear:  Tympanic membrane and external ear normal.  Left Ear: External ear normal.  Mouth/Throat: Uvula is midline, oropharynx is clear and moist and mucous membranes are normal. No oropharyngeal exudate, posterior oropharyngeal edema or posterior oropharyngeal erythema.  Eyes: Conjunctivae are normal. Right eye exhibits no discharge. Left eye exhibits no discharge. No scleral icterus.  Neck: Neck supple. No tracheal deviation present.  Cardiovascular: Normal rate, regular rhythm and intact distal pulses.   Pulmonary/Chest: Effort normal and breath sounds normal. No stridor. No respiratory distress. He has no wheezes. He has no rales.  Abdominal: Soft. Bowel sounds are normal. He exhibits no distension. There is no tenderness. There is no rebound and no guarding.  Musculoskeletal: He exhibits no edema or tenderness.  Neurological: He is alert. He has normal strength. No cranial nerve deficit (no facial droop, extraocular movements intact, no slurred speech) or sensory deficit. He exhibits normal muscle tone. He displays no seizure activity. Coordination normal.  Skin: Skin is warm and dry. No rash noted.  Psychiatric: He has a normal mood and affect.  Nursing note and vitals reviewed.   ED Course  Procedures (including critical care time) Labs Review Labs Reviewed - No data to display  Imaging Review Dg Chest 2 View  11/10/2015  CLINICAL DATA:  Productive cough, congestion and chills for a few days. Initial encounter. EXAM: CHEST  2 VIEW COMPARISON:  None. FINDINGS: There is focal airspace disease in the medial left lower lobe consistent with pneumonia. The right lung is clear. Heart size is normal. No pneumothorax or pleural effusion. IMPRESSION: Findings consistent with left lower lobe pneumonia. Recommend followup films to clearing. Electronically Signed   By: Drusilla Kannerhomas  Dalessio M.D.   On: 11/10/2015 10:09   I have personally reviewed and evaluated these images and lab results as part of my medical  decision-making.    MDM   Final diagnoses:  LLL pneumonia    Patient's x-ray suggests a left lower lobe pneumonia. This is consistent with his clinical symptoms. She is nontoxic and well-appearing. He is not hypoxic.  Plan to discharge him home with a prescription of amoxicillin and azithromycin to cover for atypical and resistant Streptococcus pneumonia.  Patient encouraged to follow-up with a primary care doctor and to also have repeat films in approximately a month or so to make sure the CXR finding has resolved.    Linwood DibblesJon Malone Admire, MD 11/10/15 [phone]

## 2015-11-10 NOTE — ED Notes (Signed)
He c/o congested cough, productive of yellow phlegm for past few days.

## 2016-05-31 ENCOUNTER — Emergency Department (HOSPITAL_COMMUNITY): Payer: BLUE CROSS/BLUE SHIELD

## 2016-05-31 ENCOUNTER — Encounter (HOSPITAL_COMMUNITY): Payer: Self-pay

## 2016-05-31 ENCOUNTER — Emergency Department (HOSPITAL_COMMUNITY)
Admission: EM | Admit: 2016-05-31 | Discharge: 2016-05-31 | Disposition: A | Discharge status: Home / Self Care | Payer: BLUE CROSS/BLUE SHIELD | Attending: Emergency Medicine | Admitting: Emergency Medicine

## 2016-05-31 DIAGNOSIS — L03116 Cellulitis of left lower limb: Secondary | ICD-10-CM | POA: Diagnosis not present

## 2016-05-31 DIAGNOSIS — S91309A Unspecified open wound, unspecified foot, initial encounter: Secondary | ICD-10-CM

## 2016-05-31 DIAGNOSIS — L089 Local infection of the skin and subcutaneous tissue, unspecified: Secondary | ICD-10-CM | POA: Diagnosis present

## 2016-05-31 DIAGNOSIS — F17210 Nicotine dependence, cigarettes, uncomplicated: Secondary | ICD-10-CM | POA: Insufficient documentation

## 2016-05-31 DIAGNOSIS — L03115 Cellulitis of right lower limb: Secondary | ICD-10-CM | POA: Insufficient documentation

## 2016-05-31 DIAGNOSIS — Z79899 Other long term (current) drug therapy: Secondary | ICD-10-CM | POA: Diagnosis not present

## 2016-05-31 HISTORY — DX: Pneumonia, unspecified organism: J18.9

## 2016-05-31 LAB — CBG MONITORING, ED: Glucose-Capillary: 77 mg/dL (ref 65–99)

## 2016-05-31 MED ORDER — IBUPROFEN 800 MG PO TABS: 800.0000 mg | ORAL_TABLET | Freq: Three times a day (TID) | ORAL | 0 refills | Status: DC | PRN

## 2016-05-31 MED ORDER — BACITRACIN ZINC 500 UNIT/GM EX OINT
TOPICAL_OINTMENT | CUTANEOUS | Status: AC
  Administered 2016-05-31: 2
  Filled 2016-05-31: qty 1.8

## 2016-05-31 MED ORDER — HYDROCODONE-ACETAMINOPHEN 5-325 MG PO TABS: 1.0000 | ORAL_TABLET | ORAL | 0 refills | Status: DC | PRN

## 2016-05-31 MED ORDER — DOXYCYCLINE HYCLATE 100 MG PO CAPS: 100.0000 mg | ORAL_CAPSULE | Freq: Two times a day (BID) | ORAL | 0 refills | Status: DC

## 2016-05-31 MED ORDER — DOXYCYCLINE HYCLATE 100 MG PO TABS
100.0000 mg | ORAL_TABLET | Freq: Once | ORAL | Status: AC
  Administered 2016-05-31: 100 mg via ORAL
  Filled 2016-05-31: qty 1

## 2016-05-31 NOTE — Discharge Instructions (Signed)
Take antibiotics as prescribed. Keep the wounds clean with warm water and soap. Keep covered loosely with gauze and antibiotic ointment. I have given you a note to take some time off from work until you can see orthopedics and get cleared to return to work. Buy some new socks that will help keep your feet dry in your work boots. Make sure your boots fit properly. Follow up with ortho as soon as possible. Return to the ER for new or worsening symptoms.

## 2016-05-31 NOTE — ED Provider Notes (Signed)
[facility]-EMERGENCY DEPT Provider Note   CSN: [id number] Arrival date & time: 05/31/16  1713  By signing my name below, I, Sonum Patel, attest that this documentation has been prepared under the direction and in the presence of Serena Sam, [place]. Electronically Signed: Sonum Patel, Neurosurgeoncribe. 05/31/16. 6:51 PM.  History   Chief Complaint Chief Complaint  Patient presents with  . Wound Infection    BILATERAL FEET    The history is provided by the patient. No language interpreter was used.     HPI Comments: Susette RacerJohn Walden III is a 40 y.o. male who presents to the Emergency Department complaining of multiple wounds to the bilateral feet that he noticed 3 days ago. 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 He states often his feet get sweaty enough to have damp socks. He states over the weekend his feet were mildly red and peeling but within about 1 day his feet were bloody and appeared as though they had been mauled. He has applied hydrogen peroxide to the affected areas without relief. He reports associated pus-like drainage. He denies fever. He denies history of DM. States before this past weekend he had no problems with his shoes or feet. [facility] for [facility].  Past Medical History:  Diagnosis Date  . Pneumonia   . Synovitis of right shoulder 09/2014  . 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 Tendinosis 09/2014   right shoulder    There are no active problems to display for this patient.   Past Surgical History:  Procedure Laterality Date  . APPENDECTOMY    . SHOULDER ARTHROSCOPY WITH SUBACROMIAL DECOMPRESSION, ROTATOR CUFF REPAIR AND BICEP TENDON REPAIR Right 10/01/2014   Procedure: RIGHT SHOULDER ARTHROSCOPY DEBRIDEMENT,  ACROMIOPLASTY,  DISTAL CLAVICAL EXCISION, OPEN  BICEPS TENODESIS,SUBACROMIAL DECOMPRESSION,DEBRIDE LABRUM;  Surgeon: Mckinley Jewelaniel Murphy, MD;  Location: [facility];  Service: Orthopedics;  Laterality: Right;       Home Medications    Prior to Admission medications   Medication Sig Start Date End Date Taking? Authorizing Provider  amoxicillin (AMOXIL) 500 MG capsule Take 1 capsule (500 mg total) by mouth 3 (three) times daily. 11/10/15   Linwood DibblesJon Knapp, MD  azithromycin (ZITHROMAX Z-PAK) 250 MG tablet Take 2 tabs by mouth on day 1 then 1 tab by mouth daily on days 2-5 11/10/15   Linwood DibblesJon Knapp, MD  BLACK CURRANT SEED OIL PO Take 1 tablet by mouth daily.    Historical Provider, MD  guaiFENesin (MUCINEX) 600 MG 12 hr tablet Take 600 mg by mouth 2 (two) times daily as needed.    Historical Provider, MD  meloxicam (MOBIC) 7.5 MG tablet Take 1 tablet (7.5 mg total) by mouth 2 (two) times daily with a meal. NO other NSAIDS, max of 2 pilsl per day Patient not taking: Reported on 11/10/2015 08/31/14   Thao P Le, DO  methocarbamol (ROBAXIN) 500 MG tablet Take 1 tablet (500 mg total) by mouth 4 (four) times daily. Patient not taking: Reported on 11/10/2015 10/01/14   Cristie HemMary L Stanbery, PA-C  Multiple Vitamin (MULTIVITAMIN WITH MINERALS) TABS tablet Take 1 tablet by mouth daily.    Historical Provider, MD  naproxen (NAPROSYN) 500 MG tablet Take 500 mg by mouth 2 (two) times daily as needed for moderate pain.    Historical Provider, MD  Omega-3 Fatty Acids (OMEGA-3 FISH OIL PO) Take 1 capsule by  mouth daily.     Historical Provider, MD  ondansetron (ZOFRAN) 4 MG tablet Take 1 tablet (4 mg total) by mouth every 8 (eight) hours as needed for nausea or vomiting. Patient not taking: Reported on 11/10/2015 10/01/14   Cristie HemMary L Stanbery, PA-C  oxyCODONE-acetaminophen (ROXICET) 5-325 MG per tablet Take 1-2 tablets by mouth every 4 (four) hours as needed. Patient not taking: Reported on 11/10/2015 10/01/14   Cristie HemMary L Stanbery, PA-C  Pseudoephedrine-APAP-DM (DAYQUIL MULTI-SYMPTOM PO) Take 30 mLs by mouth daily as needed (cold symptoms).    Historical Provider, MD    Family  History Family History  Problem Relation Age of Onset  . Diabetes Father   . Heart disease Father   . Hypertension Father     Social History Social History  Substance Use Topics  . Smoking status: Current Every Day Smoker    Packs/day: 0.50    Years: 10.00    Types: Cigarettes  . Smokeless tobacco: Never Used  . Alcohol use Yes     Comment: 2-3 x/week     Allergies   Patient has no known allergies.   Review of Systems Review of Systems  Constitutional: Negative for fever.  Skin: Positive for wound.  Neurological: Negative for numbness.  All other systems reviewed and are negative.    Physical Exam Updated Vital Signs BP 123/84 (BP Location: Right Arm)   Pulse (!) 59   Temp 97.6 F (36.4 C) (Oral)   Resp 18   Ht 6\' 3"  (1.905 m)   Wt 160 lb (72.6 kg)   SpO2 100%   BMI 20.00 kg/m   Physical Exam  Constitutional: He is oriented to person, place, and time. He appears well-developed and well-nourished.  HENT:  Head: Normocephalic and atraumatic.  Cardiovascular: Normal rate.   Pulmonary/Chest: Effort normal.  Musculoskeletal:  See photos below. Bilateral plantar surface of feet with macerated, blistering areas of wound to toes and balls of the feet. Tender. No purulent drainage expressed.  Neurological: He is alert and oriented to person, place, and time.  Skin: Skin is warm and dry.  Psychiatric: He has a normal mood and affect.  Nursing note and vitals reviewed.        ED Treatments / Results  DIAGNOSTIC STUDIES: Oxygen Saturation is 100% on RA, normal by my interpretation.    COORDINATION OF CARE: 6:52 PM Discussed treatment plan with pt at bedside and pt agreed to plan.   Labs (all labs ordered are listed, but only abnormal results are displayed) Labs Reviewed  CBG MONITORING, ED    EKG  EKG Interpretation None       Radiology Dg Foot Complete Left  Result Date: 05/31/2016 CLINICAL DATA:  Pain. Patient reports nonhealing wounds to  plantar surface of both feet for 5 days. No known injury. EXAM: LEFT FOOT - COMPLETE 3+ VIEW COMPARISON:  None. FINDINGS: There is no evidence of fracture or dislocation. There is no evidence of arthropathy or other focal bone abnormality. No bony destructive change. Incidental bipartite tibial sesamoid. Soft tissues are unremarkable. No soft tissue air or radiopaque foreign body. IMPRESSION: Negative radiographs of the left foot. Electronically Signed   By: Rubye OaksMelanie  Ehinger M.D.   On: 05/31/2016 18:27   Dg Foot Complete Right  Result Date: 05/31/2016 CLINICAL DATA:  Pain. Patient reports nonhealing wounds to plantar foot for 5 days. EXAM: RIGHT FOOT COMPLETE - 3+ VIEW COMPARISON:  None. FINDINGS: There is no evidence of fracture or dislocation. Minimal hallux valgus.  There is no evidence of arthropathy or other focal bone abnormality. No bony destructive change. Suspected tiny 2 mm linear foreign body in the plantar soft tissues subjacent to the great toe distal phalanx. IMPRESSION: 1. Suspected 2 mm foreign body in the great toe plantar soft tissues. 2. No acute osseous abnormality.  Minimal hallux valgus. Electronically Signed   By: Rubye Oaks M.D.   On: 05/31/2016 18:30    Procedures Procedures (including critical care time)  Medications Ordered in ED Medications  doxycycline (VIBRA-TABS) tablet 100 mg (not administered)     Initial Impression / Assessment and Plan / ED Course  I have reviewed the triage vital signs and the nursing notes.  Pertinent labs & imaging results that were available during my care of the patient were reviewed by me and considered in my medical decision making (see chart for details).  Clinical Course     Pt case discussed with my attending physician Dr. Particia Nearing. Tissue appears to be macerated and poorly healing likely from poorly fitting shoes and damp socks for prolonged periods of time. No surrounding cellulitis. Imaging negative for osteo or bony  involvement. We will help clean and dress wounds here. Will provide post op shoes. Will start on course of doxy. CBG is 77. Will instruct f/u with ortho or podiatry. Pt has seen [facility] [facility] in the past for his shoulder. Will write work note.  Final Clinical Impressions(s) / ED Diagnoses   Final diagnoses:  None  foot cellulitis  New Prescriptions New Prescriptions   No medications on file   I personally performed the services described in this documentation, which was scribed in my presence. The recorded information has been reviewed and is accurate.  Medical screening examination/treatment/procedure(s) were performed by non-physician practitioner and as supervising physician I was immediately available for consultation/collaboration.   EKG Interpretation None        Jacalyn Lefevre, MD 06/11/16 [phone]

## 2016-05-31 NOTE — ED Triage Notes (Signed)
PT C/O INFECTED WOUNDS TO THE BILATERAL FEET SINCE Friday. PT STS HIS SKIN HAD BEGAN TO PEEL FROM Friday, AND SO HE WAS TREATING IT PEROXIDE. ON Sunday, HE NOTICED BLOOD AND THEN PAIN. PT STS HE WENT TO AN [facility] TODAY, AND WAS TOLD THAT HE WOULD NEED IV ABX AND INTENSE WOUND CARE. DENIES FEVER.

## 2016-12-07 ENCOUNTER — Encounter: Payer: Self-pay | Admitting: Emergency Medicine

## 2016-12-07 ENCOUNTER — Ambulatory Visit (INDEPENDENT_AMBULATORY_CARE_PROVIDER_SITE_OTHER): Payer: BLUE CROSS/BLUE SHIELD | Admitting: Emergency Medicine

## 2016-12-07 VITALS — BP 125/77 | HR 64 | Temp 98.1°F | Resp 16 | Ht 75.0 in | Wt 156.4 lb

## 2016-12-07 DIAGNOSIS — Z0000 Encounter for general adult medical examination without abnormal findings: Secondary | ICD-10-CM | POA: Diagnosis not present

## 2016-12-07 DIAGNOSIS — L309 Dermatitis, unspecified: Secondary | ICD-10-CM | POA: Insufficient documentation

## 2016-12-07 LAB — POCT URINALYSIS DIP (MANUAL ENTRY)
BILIRUBIN UA: NEGATIVE
BILIRUBIN UA: NEGATIVE mg/dL
Glucose, UA: NEGATIVE mg/dL
Leukocytes, UA: NEGATIVE
Nitrite, UA: NEGATIVE
PH UA: 6 (ref 5.0–8.0)
Protein Ur, POC: NEGATIVE mg/dL
RBC UA: NEGATIVE
SPEC GRAV UA: 1.02 (ref 1.010–1.025)
Urobilinogen, UA: 0.2 E.U./dL

## 2016-12-07 MED ORDER — PREDNISONE 20 MG PO TABS: 40.0000 mg | ORAL_TABLET | Freq: Every day | ORAL | 0 refills | Status: AC

## 2016-12-07 MED ORDER — TRIAMCINOLONE ACETONIDE 0.1 % EX CREA: 1.0000 "application " | TOPICAL_CREAM | Freq: Two times a day (BID) | CUTANEOUS | 0 refills | Status: DC

## 2016-12-07 NOTE — Progress Notes (Signed)
Shawn Todd 41 y.o.   Chief Complaint  Patient presents with  . Annual Exam  . Rash    patient has a rash on both arm     HISTORY OF PRESENT ILLNESS: This is a 41 y.o. male complaining of rash to both arms x several days. Also requesting general exam and blood work.  HPI   Prior to Admission medications   Medication Sig Start Date End Date Taking? Authorizing Provider  naproxen (NAPROSYN) 500 MG tablet Take 500 mg by mouth 2 (two) times daily as needed for moderate pain.   Yes [provider]  amoxicillin (AMOXIL) 500 MG capsule Take 1 capsule (500 mg total) by mouth 3 (three) times daily. Patient not taking: Reported on 12/07/2016 11/10/15   Linwood Dibbles, MD  azithromycin (ZITHROMAX Z-PAK) 250 MG tablet Take 2 tabs by mouth on day 1 then 1 tab by mouth daily on days 2-5 Patient not taking: Reported on 12/07/2016 11/10/15   Linwood Dibbles, MD  BLACK CURRANT SEED OIL PO Take 1 tablet by mouth daily.    [provider]  doxycycline (VIBRAMYCIN) 100 MG capsule Take 1 capsule (100 mg total) by mouth 2 (two) times daily. Patient not taking: Reported on 12/07/2016 05/31/16   Sam, Ace Gins, PA-C  guaiFENesin (MUCINEX) 600 MG 12 hr tablet Take 600 mg by mouth 2 (two) times daily as needed.    [provider]  HYDROcodone-acetaminophen (NORCO/VICODIN) 5-325 MG tablet Take 1-2 tablets by mouth every 4 (four) hours as needed for moderate pain or severe pain. Patient not taking: Reported on 12/07/2016 05/31/16   Trixie Dredge, PA-C  ibuprofen (ADVIL,MOTRIN) 800 MG tablet Take 1 tablet (800 mg total) by mouth every 8 (eight) hours as needed for mild pain or moderate pain. Patient not taking: Reported on 12/07/2016 05/31/16   Trixie Dredge, PA-C  meloxicam (MOBIC) 7.5 MG tablet Take 1 tablet (7.5 mg total) by mouth 2 (two) times daily with a meal. NO other NSAIDS, max of 2 pilsl per day Patient not taking: Reported on 11/10/2015 08/31/14   Le, Thao P, DO  methocarbamol (ROBAXIN) 500 MG  tablet Take 1 tablet (500 mg total) by mouth 4 (four) times daily. Patient not taking: Reported on 11/10/2015 10/01/14   Cristie Hem, PA-C  Multiple Vitamin (MULTIVITAMIN WITH MINERALS) TABS tablet Take 1 tablet by mouth daily.    [provider]  Omega-3 Fatty Acids (OMEGA-3 FISH OIL PO) Take 1 capsule by mouth daily.     [provider]  ondansetron (ZOFRAN) 4 MG tablet Take 1 tablet (4 mg total) by mouth every 8 (eight) hours as needed for nausea or vomiting. Patient not taking: Reported on 11/10/2015 10/01/14   Cristie Hem, PA-C  oxyCODONE-acetaminophen (ROXICET) 5-325 MG per tablet Take 1-2 tablets by mouth every 4 (four) hours as needed. Patient not taking: Reported on 11/10/2015 10/01/14   Cristie Hem, PA-C  Pseudoephedrine-APAP-DM (DAYQUIL MULTI-SYMPTOM PO) Take 30 mLs by mouth daily as needed (cold symptoms).    [provider]    No Known Allergies  There are no active problems to display for this patient.   Past Medical History:  Diagnosis Date  . Pneumonia   . Synovitis of right shoulder 09/2014  . 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 Tendinosis 09/2014   right shoulder    Past Surgical History:  Procedure Laterality Date  . APPENDECTOMY    . SHOULDER ARTHROSCOPY WITH SUBACROMIAL DECOMPRESSION, ROTATOR  CUFF REPAIR AND BICEP TENDON REPAIR Right 10/01/2014   Procedure: RIGHT SHOULDER ARTHROSCOPY DEBRIDEMENT,  ACROMIOPLASTY,  DISTAL CLAVICAL EXCISION, OPEN  BICEPS TENODESIS,SUBACROMIAL DECOMPRESSION,DEBRIDE LABRUM;  Surgeon: Mckinley Jewelaniel Murphy, MD;  Location: [facility];  Service: Orthopedics;  Laterality: Right;    Social History   Social History  . Marital status: Single    Spouse name: N/A  . Number of children: N/A  . Years of education: N/A   Occupational History  . Not on file.   Social History Main Topics  . Smoking status: Current Every Day Smoker    Packs/day: 0.50    Years: 10.00    Types:  Cigarettes  . Smokeless tobacco: Never Used  . Alcohol use Yes     Comment: 2-3 x/week  . Drug use: No  . Sexual activity: Not on file   Other Topics Concern  . Not on file   Social History Narrative  . No narrative on file    Family History  Problem Relation Age of Onset  . Diabetes Father   . Heart disease Father   . Hypertension Father   . Cancer Father   . Hyperlipidemia Father      Review of Systems  Constitutional: Negative.  Negative for chills, fever and weight loss.  HENT: Negative.  Negative for congestion, nosebleeds and sore throat.   Eyes: Negative.  Negative for blurred vision, double vision, discharge and redness.  Respiratory: Negative.  Negative for cough, hemoptysis and shortness of breath.   Cardiovascular: Negative.  Negative for chest pain, palpitations and leg swelling.  Gastrointestinal: Negative.  Negative for abdominal pain, blood in stool, diarrhea, melena, nausea and vomiting.  Genitourinary: Negative.  Negative for dysuria, frequency and hematuria.  Musculoskeletal: Negative.  Negative for back pain, myalgias and neck pain.  Skin: Positive for itching and rash.  Neurological: Negative for dizziness, sensory change, focal weakness and headaches.  Endo/Heme/Allergies: Negative.   All other systems reviewed and are negative.  Vitals:   12/07/16 1333  BP: 125/77  Pulse: 64  Resp: 16  Temp: 98.1 F (36.7 C)     Physical Exam  Constitutional: He is oriented to person, place, and time. He appears well-developed and well-nourished.  HENT:  Head: Normocephalic.  Right Ear: External ear normal.  Left Ear: External ear normal.  Nose: Nose normal.  Mouth/Throat: Oropharynx is clear and moist. No oropharyngeal exudate.  Eyes: Conjunctivae and EOM are normal. Pupils are equal, round, and reactive to light.  Neck: Normal range of motion. Neck supple. No JVD present. No thyromegaly present.  Cardiovascular: Normal rate, regular rhythm, normal  heart sounds and intact distal pulses.   Pulmonary/Chest: Effort normal and breath sounds normal.  Abdominal: Soft. Bowel sounds are normal. He exhibits no distension and no mass. There is no tenderness. No hernia.  Musculoskeletal: Normal range of motion.  Neurological: He is alert and oriented to person, place, and time. He displays normal reflexes. No sensory deficit. He exhibits normal muscle tone.  Skin: Skin is warm and dry. Capillary refill takes less than 2 seconds. Rash noted.  +maculo-papular rash to arms c/w poison ivy.  Psychiatric: He has a normal mood and affect. His behavior is normal.  Vitals reviewed.    ASSESSMENT & PLAN: Shawn RuizJohn was seen today for annual exam and rash.  Diagnoses and all orders for this visit:  Dermatitis Comments: both arms  Routine general medical examination at a health care facility -     CBC with Differential/Platelet -  Comprehensive metabolic panel -     Hemoglobin A1c -     TSH -     PSA -     POCT urinalysis dipstick -     HIV antibody  Other orders -     predniSONE (DELTASONE) 20 MG tablet; Take 2 tablets (40 mg total) by mouth daily with breakfast. -     triamcinolone cream (KENALOG) 0.1 %; Apply 1 application topically 2 (two) times daily.    Patient Instructions       IF you received an x-ray today, you will receive an invoice from [facility]. Please contact [facility] at [phone] with questions or concerns regarding your invoice.   IF you received labwork today, you will receive an invoice from [facility]. Please contact [facility] at [phone] with questions or concerns regarding your invoice.   Our billing staff will not be able to assist you with questions regarding bills from these companies.  You will be contacted with the lab results as soon as they are available. The fastest way to get your results is to activate your My Chart account. Instructions are located on the last page of this  paperwork. If you have not heard from [place] regarding the results in 2 weeks, please contact this office.      Contact Dermatitis Dermatitis is redness, soreness, and swelling (inflammation) of the skin. Contact dermatitis is a reaction to certain substances that touch the skin. You either touched something that irritated your skin, or you have allergies to something you touched. Follow these instructions at home: Skin Care  Moisturize your skin as needed.  Apply cool compresses to the affected areas.  Try taking a bath with: ? Epsom salts. Follow the instructions on the package. You can get these at a pharmacy or grocery store. ? Baking soda. Pour a small amount into the bath as told by your doctor. ? Colloidal oatmeal. Follow the instructions on the package. You can get this at a pharmacy or grocery store.  Try applying baking soda paste to your skin. Stir water into baking soda until it looks like paste.  Do not scratch your skin.  Bathe less often.  Bathe in lukewarm water. Avoid using hot water. Medicines  Take or apply over-the-counter and prescription medicines only as told by your doctor.  If you were prescribed an antibiotic medicine, take or apply your antibiotic as told by your doctor. Do not stop taking the antibiotic even if your condition starts to get better. General instructions  Keep all follow-up visits as told by your doctor. This is important.  Avoid the substance that caused your reaction. If you do not know what caused it, keep a journal to try to track what caused it. Write down: ? What you eat. ? What cosmetic products you use. ? What you drink. ? What you wear in the affected area. This includes jewelry.  If you were given a bandage (dressing), take care of it as told by your doctor. This includes when to change and remove it. Contact a doctor if:  You do not get better with treatment.  Your condition gets worse.  You have signs of infection such  as: ? Swelling. ? Tenderness. ? Redness. ? Soreness. ? Warmth.  You have a fever.  You have new symptoms. Get help right away if:  You have a very bad headache.  You have neck pain.  Your neck is stiff.  You throw up (vomit).  You feel very sleepy.  You see red streaks coming from the affected area.  Your bone or joint underneath the affected area becomes painful after the skin has healed.  The affected area turns darker.  You have trouble breathing. This information is not intended to replace advice given to you by your health care provider. Make sure you discuss any questions you have with your health care provider. Document Released: 04/09/2009 Document Revised: 11/18/2015 Document Reviewed: 10/28/2014 Elsevier Interactive Patient Education  2018 Elsevier Inc.      Edwina Barth, MD [facility]

## 2016-12-07 NOTE — Patient Instructions (Addendum)
     IF you received an x-ray today, you will receive an invoice from [facility]. Please contact [facility] at [phone] with questions or concerns regarding your invoice.   IF you received labwork today, you will receive an invoice from [facility]. Please contact [facility] at [phone] with questions or concerns regarding your invoice.   Our billing staff will not be able to assist you with questions regarding bills from these companies.  You will be contacted with the lab results as soon as they are available. The fastest way to get your results is to activate your My Chart account. Instructions are located on the last page of this paperwork. If you have not heard from us regarding the results in 2 weeks, please contact this office.     Contact Dermatitis Dermatitis is redness, soreness, and swelling (inflammation) of the skin. Contact dermatitis is a reaction to certain substances that touch the skin. You either touched something that irritated your skin, or you have allergies to something you touched. Follow these instructions at home: Skin Care  Moisturize your skin as needed.  Apply cool compresses to the affected areas.  Try taking a bath with: ? Epsom salts. Follow the instructions on the package. You can get these at a pharmacy or grocery store. ? Baking soda. Pour a small amount into the bath as told by your doctor. ? Colloidal oatmeal. Follow the instructions on the package. You can get this at a pharmacy or grocery store.  Try applying baking soda paste to your skin. Stir water into baking soda until it looks like paste.  Do not scratch your skin.  Bathe less often.  Bathe in lukewarm water. Avoid using hot water. Medicines  Take or apply over-the-counter and prescription medicines only as told by your doctor.  If you were prescribed an antibiotic medicine, take or apply your antibiotic as told by your doctor. Do not stop taking the antibiotic  even if your condition starts to get better. General instructions  Keep all follow-up visits as told by your doctor. This is important.  Avoid the substance that caused your reaction. If you do not know what caused it, keep a journal to try to track what caused it. Write down: ? What you eat. ? What cosmetic products you use. ? What you drink. ? What you wear in the affected area. This includes jewelry.  If you were given a bandage (dressing), take care of it as told by your doctor. This includes when to change and remove it. Contact a doctor if:  You do not get better with treatment.  Your condition gets worse.  You have signs of infection such as: ? Swelling. ? Tenderness. ? Redness. ? Soreness. ? Warmth.  You have a fever.  You have new symptoms. Get help right away if:  You have a very bad headache.  You have neck pain.  Your neck is stiff.  You throw up (vomit).  You feel very sleepy.  You see red streaks coming from the affected area.  Your bone or joint underneath the affected area becomes painful after the skin has healed.  The affected area turns darker.  You have trouble breathing. This information is not intended to replace advice given to you by your health care provider. Make sure you discuss any questions you have with your health care provider. Document Released: 04/09/2009 Document Revised: 11/18/2015 Document Reviewed: 10/28/2014 Elsevier Interactive Patient Education  2018 Elsevier Inc.  

## 2016-12-08 LAB — CBC WITH DIFFERENTIAL/PLATELET
BASOS ABS: 0 10*3/uL (ref 0.0–0.2)
Basos: 0 %
EOS (ABSOLUTE): 0.1 10*3/uL (ref 0.0–0.4)
Eos: 2 %
HEMOGLOBIN: 15 g/dL (ref 13.0–17.7)
Hematocrit: 45 % (ref 37.5–51.0)
Immature Grans (Abs): 0 10*3/uL (ref 0.0–0.1)
Immature Granulocytes: 0 %
LYMPHS ABS: 1.8 10*3/uL (ref 0.7–3.1)
Lymphs: 30 %
MCH: 29.5 pg (ref 26.6–33.0)
MCHC: 33.3 g/dL (ref 31.5–35.7)
MCV: 89 fL (ref 79–97)
MONOCYTES: 7 %
MONOS ABS: 0.4 10*3/uL (ref 0.1–0.9)
NEUTROS ABS: 3.7 10*3/uL (ref 1.4–7.0)
Neutrophils: 61 %
PLATELETS: 194 10*3/uL (ref 150–379)
RBC: 5.08 x10E6/uL (ref 4.14–5.80)
RDW: 14.1 % (ref 12.3–15.4)
WBC: 6 10*3/uL (ref 3.4–10.8)

## 2016-12-08 LAB — COMPREHENSIVE METABOLIC PANEL
A/G RATIO: 2 (ref 1.2–2.2)
ALT: 16 IU/L (ref 0–44)
AST: 25 IU/L (ref 0–40)
Albumin: 4.2 g/dL (ref 3.5–5.5)
Alkaline Phosphatase: 96 IU/L (ref 39–117)
BUN/Creatinine Ratio: 11 (ref 9–20)
BUN: 10 mg/dL (ref 6–24)
Bilirubin Total: 0.5 mg/dL (ref 0.0–1.2)
CALCIUM: 8.9 mg/dL (ref 8.7–10.2)
CO2: 23 mmol/L (ref 20–29)
Chloride: 105 mmol/L (ref 96–106)
Creatinine, Ser: 0.9 mg/dL (ref 0.76–1.27)
GFR, EST AFRICAN AMERICAN: 122 mL/min/{1.73_m2} (ref >59)
GFR, EST NON AFRICAN AMERICAN: 106 mL/min/{1.73_m2} (ref >59)
Globulin, Total: 2.1 g/dL (ref 1.5–4.5)
Glucose: 82 mg/dL (ref 65–99)
Potassium: 4.6 mmol/L (ref 3.5–5.2)
Sodium: 144 mmol/L (ref 134–144)
TOTAL PROTEIN: 6.3 g/dL (ref 6.0–8.5)

## 2016-12-08 LAB — HEMOGLOBIN A1C
Est. average glucose Bld gHb Est-mCnc: 108 mg/dL
HEMOGLOBIN A1C: 5.4 % (ref 4.8–5.6)

## 2016-12-08 LAB — PSA: PROSTATE SPECIFIC AG, SERUM: 1 ng/mL (ref 0.0–4.0)

## 2016-12-08 LAB — TSH: TSH: 1.81 u[IU]/mL (ref 0.450–4.500)

## 2016-12-08 LAB — HIV ANTIBODY (ROUTINE TESTING W REFLEX): HIV SCREEN 4TH GENERATION: NONREACTIVE

## 2016-12-21 ENCOUNTER — Telehealth: Payer: Self-pay | Admitting: Emergency Medicine

## 2016-12-21 NOTE — Telephone Encounter (Signed)
Pt has questions about his lab results  Best number [phone]

## 2016-12-26 NOTE — Telephone Encounter (Signed)
All labs are normal. 

## 2016-12-26 NOTE — Telephone Encounter (Signed)
Please advise 

## 2016-12-28 NOTE — Telephone Encounter (Signed)
Called x1. Voicemail not set up.

## 2017-06-13 ENCOUNTER — Ambulatory Visit: Payer: Self-pay

## 2017-06-13 NOTE — Telephone Encounter (Signed)
   Reason for Disposition . Foreign body (FB) stuck on eyeball  (Exception: contact lens)  Answer Assessment - Initial Assessment Questions 1. TYPE OF FOREIGN BODY: "What got in the eye?"      Metal 2 days ago 2. ONSET: "When did it happen?"      2 days ago 3. MECHANISM: "How did it happen?"      Fell in my eye 4. VISION: "Do you have blurred vision?"      No 5. PAIN: "Is it painful?" If so, ask: "How bad is the pain?"  (Scale 1-10; or mild, moderate, severe)     4 6. CONTACTS: "Do you wear contacts?"     nO 7. OTHER SYMPTOMS: "Do you have any other symptoms?"     "Droopy" eyelid 8. PREGNANCY: "Is there any chance you are pregnant?" "When was your last menstrual period?"     No  Protocols used: EYE - FOREIGN BODY-A-AH  Pt. States he can see foreign body at the top of the eyeball. Instructed pt. To go to ED for treatment. Verbalizes understanding.

## 2018-02-21 ENCOUNTER — Encounter: Payer: Self-pay | Admitting: Family Medicine

## 2018-02-21 ENCOUNTER — Ambulatory Visit (INDEPENDENT_AMBULATORY_CARE_PROVIDER_SITE_OTHER): Payer: BLUE CROSS/BLUE SHIELD | Admitting: Family Medicine

## 2018-02-21 ENCOUNTER — Other Ambulatory Visit: Payer: Self-pay

## 2018-02-21 VITALS — BP 127/69 | HR 60 | Temp 97.9°F | Ht 75.0 in | Wt 157.0 lb

## 2018-02-21 DIAGNOSIS — Z23 Encounter for immunization: Secondary | ICD-10-CM | POA: Diagnosis not present

## 2018-02-21 DIAGNOSIS — W57XXXA Bitten or stung by nonvenomous insect and other nonvenomous arthropods, initial encounter: Secondary | ICD-10-CM | POA: Diagnosis not present

## 2018-02-21 DIAGNOSIS — N509 Disorder of male genital organs, unspecified: Secondary | ICD-10-CM

## 2018-02-21 MED ORDER — CIPROFLOXACIN HCL 500 MG PO TABS: 500.0000 mg | ORAL_TABLET | Freq: Two times a day (BID) | ORAL | 0 refills | Status: AC

## 2018-02-21 NOTE — Progress Notes (Signed)
8/29/201910:51 AM  Shawn RacerJohn Walden Todd 03-08-1976, 42 y.o. male [id number]  Chief Complaint  Patient presents with  . Tick Removal    head of tick was may still be there. (Camping trip)    HPI:   Patient is a 42 y.o. male with who presents today for left scrotal lesion  Was camping in late June Noticed tick bit along scrotum couple days later Removed but scrotum has remained irritated since then Noticed that he has a lesion couple days ago Intermittent nighttime chills No nightsweats, fevers, rashes Some mild nausea, no vomiting No abd pain No swollen lymph nodes No cough, SOB No confusion, headaches or dizziness No dysuria, no penile discharge No URI sx Denies any high risk behaviors  Fall Risk  02/21/2018 12/07/2016  Falls in the past year? No No     [facility] 02/21/2018 12/07/2016  Decreased Interest 0 0  Down, Depressed, Hopeless 0 0  PHQ - 2 Score 0 0    No Known Allergies  Prior to Admission medications   Medication Sig Start Date End Date Taking? Authorizing Provider  Multiple Vitamin (MULTIVITAMIN WITH MINERALS) TABS tablet Take 1 tablet by mouth daily.   Yes [provider]    Past Medical History:  Diagnosis Date  . Pneumonia   . Synovitis of right shoulder 09/2014  . 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 Tendinosis 09/2014   right shoulder    Past Surgical History:  Procedure Laterality Date  . APPENDECTOMY    . SHOULDER ARTHROSCOPY WITH SUBACROMIAL DECOMPRESSION, ROTATOR CUFF REPAIR AND BICEP TENDON REPAIR Right 10/01/2014   Procedure: RIGHT SHOULDER ARTHROSCOPY DEBRIDEMENT,  ACROMIOPLASTY,  DISTAL CLAVICAL EXCISION, OPEN  BICEPS TENODESIS,SUBACROMIAL DECOMPRESSION,DEBRIDE LABRUM;  Surgeon: Mckinley Jewelaniel Murphy, MD;  Location: [facility];  Service: Orthopedics;  Laterality: Right;    Social History   Tobacco Use  . Smoking status: Current Every Day Smoker    Packs/day: 0.50    Years: 10.00   Pack years: 5.00    Types: Cigarettes  . Smokeless tobacco: Never Used  Substance Use Topics  . Alcohol use: Yes    Comment: 2-3 x/week    Family History  Problem Relation Age of Onset  . Diabetes Father   . Heart disease Father   . Hypertension Father   . Cancer Father   . Hyperlipidemia Father     ROS Per hpi  OBJECTIVE:  Blood pressure 127/69, pulse 60, temperature 97.9 F (36.6 C), temperature source Oral, height 6\' 3"  (1.905 m), weight 157 lb (71.2 kg), SpO2 100 %. Body mass index is 19.62 kg/m.   Physical Exam  Constitutional: He is oriented to person, place, and time. He appears well-developed and well-nourished.  HENT:  Head: Normocephalic and atraumatic.  Mouth/Throat: Oropharynx is clear and moist.  Eyes: Pupils are equal, round, and reactive to light. Conjunctivae and EOM are normal.  Neck: Neck supple.  Cardiovascular: Normal rate and regular rhythm. Exam reveals no gallop and no friction rub.  No murmur heard. Pulmonary/Chest: Effort normal and breath sounds normal. He has no wheezes. He has no rales.  Genitourinary: Testes normal and penis normal.  Genitourinary Comments: Left scrotal sac with about a 4mm indurated lesion with raised borders and minimal central ulceration.   Musculoskeletal: He exhibits no edema.  Lymphadenopathy:    He has no cervical adenopathy.       Right: No inguinal and no supraclavicular adenopathy present.  Left: No inguinal and no supraclavicular adenopathy present.  Neurological: He is alert and oriented to person, place, and time.  Skin: Skin is warm and dry.  Psychiatric: He has a normal mood and affect.  Nursing note and vitals reviewed.   ASSESSMENT and PLAN  1. Scrotal skin lesion - RPR - HIV antibody - Ambulatory referral to Urology  2. Tick bite, initial encounter - Rickettsial Fever Group IgG/M - Lyme Ab/Western Blot Reflex  3. Need for vaccination - Flu Vaccine QUAD 36+ mos IM  Other orders -  ciprofloxacin (CIPRO) 500 MG tablet; Take 1 tablet (500 mg total) by mouth 2 (two) times daily.  Return if symptoms worsen or fail to improve.    Myles Lipps, MD Primary Care at [facility] [address] [place], [place] [address] Ph.  [phone] Fax [phone]

## 2018-02-21 NOTE — Patient Instructions (Signed)
° ° ° °  If you have lab work done today you will be contacted with your lab results within the next 2 weeks.  If you have not heard from us then please contact us. The fastest way to get your results is to register for My Chart. ° ° °IF you received an x-ray today, you will receive an invoice from [facility]. Please contact [facility] at [phone] with questions or concerns regarding your invoice.  ° °IF you received labwork today, you will receive an invoice from [facility]. Please contact [facility] at [phone] with questions or concerns regarding your invoice.  ° °Our billing staff will not be able to assist you with questions regarding bills from these companies. ° °You will be contacted with the lab results as soon as they are available. The fastest way to get your results is to activate your My Chart account. Instructions are located on the last page of this paperwork. If you have not heard from us regarding the results in 2 weeks, please contact this office. °  ° ° ° °

## 2018-02-23 LAB — RPR: RPR Ser Ql: NONREACTIVE

## 2018-02-23 LAB — RICKETTSIAL FEVER GROUP IGG/M
Spotted Fever Group IgG: <1:64 {titer}
Spotted Fever Group IgM: <1:64 {titer}
Typhus Fever Group IgG: <1:64 {titer}
Typhus Fever Group IgM: <1:64 {titer}

## 2018-02-23 LAB — HIV ANTIBODY (ROUTINE TESTING W REFLEX): HIV Screen 4th Generation wRfx: NONREACTIVE

## 2018-02-23 LAB — LYME AB/WESTERN BLOT REFLEX
LYME DISEASE AB, QUANT, IGM: <0.8 index (ref 0.00–0.79)
Lyme IgG/IgM Ab: <0.91 {ISR} (ref 0.00–0.90)

## 2019-02-07 ENCOUNTER — Ambulatory Visit (HOSPITAL_COMMUNITY)
Admission: EM | Admit: 2019-02-07 | Discharge: 2019-02-07 | Disposition: A | Discharge status: Home / Self Care | Payer: BC Managed Care – PPO | Attending: Urgent Care | Admitting: Urgent Care

## 2019-02-07 ENCOUNTER — Encounter (HOSPITAL_COMMUNITY): Payer: Self-pay | Admitting: Emergency Medicine

## 2019-02-07 ENCOUNTER — Other Ambulatory Visit: Payer: Self-pay

## 2019-02-07 DIAGNOSIS — R0602 Shortness of breath: Secondary | ICD-10-CM | POA: Diagnosis not present

## 2019-02-07 DIAGNOSIS — Z20828 Contact with and (suspected) exposure to other viral communicable diseases: Secondary | ICD-10-CM | POA: Diagnosis not present

## 2019-02-07 DIAGNOSIS — S39011A Strain of muscle, fascia and tendon of abdomen, initial encounter: Secondary | ICD-10-CM | POA: Diagnosis not present

## 2019-02-07 MED ORDER — NAPROXEN 500 MG PO TABS: 500.0000 mg | ORAL_TABLET | Freq: Two times a day (BID) | ORAL | 0 refills | Status: AC

## 2019-02-07 NOTE — ED Provider Notes (Addendum)
[facility]    CSN: [id number] Arrival date & time: 02/07/19  1214      History   Chief Complaint Chief Complaint  Patient presents with  . Abdominal Pain    HPI Shawn Todd is a 43 y.o. male.   Patient here concerned with abdominal pain/lower lung pain x 3 days, which started the day after he was removing overhead insulation.  He has no significant past medical history. He is concerned he inhaled insulation (he was wearing a mask at that time).  He is also concerned about lymphoma and requesting full body CT exam to ensure cause of pain is not due to lymphoma.  He reports father and sister dx with cancer.  His pain is located throughout entire abdomen.  Admits tenderness, cough, SOB, denies fever, chills, nausea, vomiting, diarrhea, dysuria, frequency, urgency.  HE is also requesting COVId19 testing.  No known exposures.  He is taking ibuprofen with moderate relief of sx.     Past Medical History:  Diagnosis Date  . Pneumonia   . Synovitis of right shoulder 09/2014  . 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 Tendinosis 09/2014   right shoulder    Patient Active Problem List   Diagnosis Date Noted  . Dermatitis 12/07/2016  . Routine general medical examination at a health care facility 12/07/2016    Past Surgical History:  Procedure Laterality Date  . APPENDECTOMY    . SHOULDER ARTHROSCOPY WITH SUBACROMIAL DECOMPRESSION, ROTATOR CUFF REPAIR AND BICEP TENDON REPAIR Right 10/01/2014   Procedure: RIGHT SHOULDER ARTHROSCOPY DEBRIDEMENT,  ACROMIOPLASTY,  DISTAL CLAVICAL EXCISION, OPEN  BICEPS TENODESIS,SUBACROMIAL DECOMPRESSION,DEBRIDE LABRUM;  Surgeon: Kathryne Hitch, MD;  Location: [facility];  Service: Orthopedics;  Laterality: Right;       Home Medications    Prior to Admission medications   Medication Sig Start Date End Date Taking? Authorizing Provider  ciprofloxacin (CIPRO) 500 MG tablet Take 1 tablet (500 mg total) by  mouth 2 (two) times daily. 02/21/18   Rutherford Guys, MD  Multiple Vitamin (MULTIVITAMIN WITH MINERALS) TABS tablet Take 1 tablet by mouth daily.    [provider]  naproxen (NAPROSYN) 500 MG tablet Take 1 tablet (500 mg total) by mouth 2 (two) times daily for 10 days. 02/07/19 02/17/19  Peri Jefferson, PA-C    Family History Family History  Problem Relation Age of Onset  . Diabetes Father   . Heart disease Father   . Hypertension Father   . Cancer Father   . Hyperlipidemia Father     Social History Social History   Tobacco Use  . Smoking status: Current Every Day Smoker    Packs/day: 0.50    Years: 10.00    Pack years: 5.00    Types: Cigarettes  . Smokeless tobacco: Never Used  Substance Use Topics  . Alcohol use: Yes    Comment: 2-3 x/week  . Drug use: No     Allergies   Patient has no known allergies.   Review of Systems Review of Systems  Constitutional: Negative for activity change, appetite change, chills, fatigue and fever.  HENT: Negative for congestion, ear discharge, ear pain, rhinorrhea, sinus pressure, sinus pain, sneezing and sore throat.   Eyes: Negative for discharge and redness.  Respiratory: Positive for cough and shortness of breath. Negative for chest tightness and wheezing.   Cardiovascular: Negative for chest pain, palpitations and leg swelling.  Gastrointestinal: Positive for abdominal pain. Negative for abdominal distention, blood  in stool, constipation, diarrhea, nausea and vomiting.  Genitourinary: Negative for discharge, dysuria, flank pain and hematuria.  Musculoskeletal: Negative for arthralgias and gait problem.  Skin: Negative for pallor and rash.  Neurological: Negative for dizziness and light-headedness.  Psychiatric/Behavioral: Negative for agitation and sleep disturbance. The patient is nervous/anxious.      Physical Exam Triage Vital Signs ED Triage Vitals  Enc Vitals Group     BP 02/07/19 1237 117/80     Pulse Rate  02/07/19 1237 (!) 53     Resp 02/07/19 1237 16     Temp 02/07/19 1237 98.4 F (36.9 C)     Temp Source 02/07/19 1237 Oral     SpO2 02/07/19 1237 98 %     Weight --      Height --      Head Circumference --      Peak Flow --      Pain Score 02/07/19 1235 4     Pain Loc --      Pain Edu? --      Excl. in GC? --    No data found.  Updated Vital Signs BP 117/80   Pulse (!) 53   Temp 98.4 F (36.9 C) (Oral)   Resp 16   SpO2 98%   Visual Acuity Right Eye Distance:   Left Eye Distance:   Bilateral Distance:    Right Eye Near:   Left Eye Near:    Bilateral Near:     Physical Exam Vitals signs and nursing note reviewed.  Constitutional:      General: He is not in acute distress.    Appearance: He is well-developed and normal weight. He is not ill-appearing, toxic-appearing or diaphoretic.  HENT:     Head: Normocephalic and atraumatic.     Mouth/Throat:     Mouth: Mucous membranes are moist.     Pharynx: No pharyngeal swelling or oropharyngeal exudate.  Eyes:     General: No scleral icterus.    Extraocular Movements: Extraocular movements intact.     Conjunctiva/sclera: Conjunctivae normal.     Pupils: Pupils are equal, round, and reactive to light.  Neck:     Musculoskeletal: Neck supple.  Cardiovascular:     Rate and Rhythm: Normal rate and regular rhythm.     Heart sounds: No murmur. No friction rub. No gallop.   Pulmonary:     Effort: Pulmonary effort is normal. No respiratory distress.     Breath sounds: Normal breath sounds. No wheezing, rhonchi or rales.  Chest:     Chest wall: No tenderness.  Abdominal:     General: Abdomen is flat. There is no distension.     Palpations: Abdomen is soft. There is no hepatomegaly.     Tenderness: There is generalized abdominal tenderness. There is no right CVA tenderness, left CVA tenderness, guarding or rebound. Negative signs include Murphy's sign, Rovsing's sign, McBurney's sign, psoas sign and obturator sign.      Hernia: No hernia is present.  Skin:    General: Skin is warm and dry.     Capillary Refill: Capillary refill takes less than 2 seconds.  Neurological:     General: No focal deficit present.     Mental Status: He is alert and oriented to person, place, and time.  Psychiatric:        Mood and Affect: Mood is anxious.        Behavior: Behavior normal.      UC Treatments / Results  Labs (all labs ordered are listed, but only abnormal results are displayed) Labs Reviewed  NOVEL CORONAVIRUS, NAA (HOSPITAL ORDER, SEND-OUT TO REF LAB)  SARS CORONAVIRUS 2    EKG   Radiology No results found.  Procedures Procedures (including critical care time)  Medications Ordered in UC Medications - No data to display  Initial Impression / Assessment and Plan / UC Course  I have reviewed the triage vital signs and the nursing notes.  Pertinent labs & imaging results that were available during my care of the patient were reviewed by me and considered in my medical decision making (see chart for details).     Abdominal pain likely muscle pain due to removing insulation day before.  Take naproxen and use ice on abdomen 15 minutes 4 times per day. Follow up with PCP for concerns about family history of CA. Final Clinical Impressions(s) / UC Diagnoses   Final diagnoses:  Strain of abdominal muscle, initial encounter     Discharge Instructions     Take medication as prescribed. Ice abdomen 15 minutes 4 times per day. Follow up with PCP for concerns about lymphoma.   ED Prescriptions    Medication Sig Dispense Auth. Provider   naproxen (NAPROSYN) 500 MG tablet Take 1 tablet (500 mg total) by mouth 2 (two) times daily for 10 days. 20 tablet Evern CoreLindquist, [place], PA-C     Controlled Substance Prescriptions [place] Controlled Substance Registry consulted? Not Applicable   Evern CoreLindquist, Sederick, PA-C 02/07/19 1305    Evern CoreLindquist, Mykah, PA-C 02/07/19 1307    Evern CoreLindquist, Elijha, PA-C 02/07/19 1311

## 2019-02-07 NOTE — Discharge Instructions (Signed)
Take medication as prescribed. Ice abdomen 15 minutes 4 times per day. Follow up with PCP for concerns about lymphoma.

## 2019-02-07 NOTE — ED Triage Notes (Signed)
PT reports pain along epigastric region/ lower lungs. 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 PT wonders if he inhaled something harmful. PT also reports family history of lymphoma and would like an xray.   PT reports slight SOB, intermittent. Slight sore throat.   PT would like covid testing

## 2019-02-09 LAB — NOVEL CORONAVIRUS, NAA (HOSP ORDER, SEND-OUT TO REF LAB; TAT 18-24 HRS): SARS-CoV-2, NAA: NOT DETECTED

## 2019-02-10 ENCOUNTER — Ambulatory Visit: Payer: BLUE CROSS/BLUE SHIELD | Admitting: Emergency Medicine

## 2019-02-11 ENCOUNTER — Encounter: Payer: Self-pay | Admitting: Emergency Medicine

## 2020-09-21 ENCOUNTER — Emergency Department (HOSPITAL_BASED_OUTPATIENT_CLINIC_OR_DEPARTMENT_OTHER): Payer: BC Managed Care – PPO

## 2020-09-21 ENCOUNTER — Other Ambulatory Visit: Payer: Self-pay

## 2020-09-21 ENCOUNTER — Encounter (HOSPITAL_BASED_OUTPATIENT_CLINIC_OR_DEPARTMENT_OTHER): Payer: Self-pay | Admitting: Emergency Medicine

## 2020-09-21 ENCOUNTER — Emergency Department (HOSPITAL_BASED_OUTPATIENT_CLINIC_OR_DEPARTMENT_OTHER)
Admission: EM | Admit: 2020-09-21 | Discharge: 2020-09-21 | Disposition: A | Discharge status: Home / Self Care | Payer: BC Managed Care – PPO | Attending: Emergency Medicine | Admitting: Emergency Medicine

## 2020-09-21 DIAGNOSIS — R42 Dizziness and giddiness: Secondary | ICD-10-CM | POA: Insufficient documentation

## 2020-09-21 DIAGNOSIS — F17210 Nicotine dependence, cigarettes, uncomplicated: Secondary | ICD-10-CM | POA: Insufficient documentation

## 2020-09-21 DIAGNOSIS — R0789 Other chest pain: Secondary | ICD-10-CM | POA: Diagnosis present

## 2020-09-21 LAB — CBC WITH DIFFERENTIAL/PLATELET
Abs Immature Granulocytes: 0.02 10*3/uL (ref 0.00–0.07)
Basophils Absolute: 0 10*3/uL (ref 0.0–0.1)
Basophils Relative: 0 %
Eosinophils Absolute: 0 10*3/uL (ref 0.0–0.5)
Eosinophils Relative: 0 %
HCT: 42.8 % (ref 39.0–52.0)
Hemoglobin: 15.1 g/dL (ref 13.0–17.0)
Immature Granulocytes: 0 %
Lymphocytes Relative: 22 %
Lymphs Abs: 2.1 10*3/uL (ref 0.7–4.0)
MCH: 29.6 pg (ref 26.0–34.0)
MCHC: 35.3 g/dL (ref 30.0–36.0)
MCV: 83.9 fL (ref 80.0–100.0)
Monocytes Absolute: 0.6 10*3/uL (ref 0.1–1.0)
Monocytes Relative: 6 %
Neutro Abs: 6.8 10*3/uL (ref 1.7–7.7)
Neutrophils Relative %: 72 %
Platelets: 255 10*3/uL (ref 150–400)
RBC: 5.1 MIL/uL (ref 4.22–5.81)
RDW: 13.6 % (ref 11.5–15.5)
WBC: 9.6 10*3/uL (ref 4.0–10.5)
nRBC: 0 % (ref 0.0–0.2)

## 2020-09-21 LAB — COMPREHENSIVE METABOLIC PANEL
ALT: 17 U/L (ref 0–44)
AST: 22 U/L (ref 15–41)
Albumin: 4.4 g/dL (ref 3.5–5.0)
Alkaline Phosphatase: 91 U/L (ref 38–126)
Anion gap: 12 (ref 5–15)
BUN: 8 mg/dL (ref 6–20)
CO2: 25 mmol/L (ref 22–32)
Calcium: 9.2 mg/dL (ref 8.9–10.3)
Chloride: 102 mmol/L (ref 98–111)
Creatinine, Ser: 0.76 mg/dL (ref 0.61–1.24)
GFR, Estimated: >60 mL/min (ref >60)
Glucose, Bld: 82 mg/dL (ref 70–99)
Potassium: 4.5 mmol/L (ref 3.5–5.1)
Sodium: 139 mmol/L (ref 135–145)
Total Bilirubin: 0.5 mg/dL (ref 0.3–1.2)
Total Protein: 7.5 g/dL (ref 6.5–8.1)

## 2020-09-21 LAB — D-DIMER, QUANTITATIVE: D-Dimer, Quant: 0.45 ug/mL-FEU (ref 0.00–0.50)

## 2020-09-21 LAB — TROPONIN I (HIGH SENSITIVITY)
Troponin I (High Sensitivity): 5 ng/L (ref <18)
Troponin I (High Sensitivity): 4 ng/L (ref <18)

## 2020-09-21 MED ORDER — CYCLOBENZAPRINE HCL 10 MG PO TABS: 10.0000 mg | ORAL_TABLET | Freq: Two times a day (BID) | ORAL | 0 refills | Status: AC | PRN

## 2020-09-21 MED ORDER — CYCLOBENZAPRINE HCL 10 MG PO TABS
10.0000 mg | ORAL_TABLET | Freq: Once | ORAL | Status: AC
  Administered 2020-09-21: 10 mg via ORAL
  Filled 2020-09-21: qty 1

## 2020-09-21 NOTE — ED Triage Notes (Signed)
Patient reports left sided chest pain that started today after taking his pain meds for recent shoulder sx.  Reports he took percocet and tramadol.  Describes pain as sharp and shooting.  Also endorses feeling a little lightheaded.  Denies any other symptoms.

## 2020-09-21 NOTE — Discharge Instructions (Signed)
I suspect you might of pulled muscles in your chest.  I given you a prescription for a muscle relaxer please take as prescribed.  Please beware this medication can make you drowsy do not consume alcohol or operate heavy machinery while taking this medication.  You may also take over-the-counter pain medication like ibuprofen and/or Tylenol every 6 hours  as needed please follow dosing back of bottle.  I like to follow-up with your PCP in 1 to 2 weeks if symptoms not fully resolved  Come back to the emergency department if you develop chest pain, shortness of breath, severe abdominal pain, uncontrolled nausea, vomiting, diarrhea.

## 2020-09-21 NOTE — ED Provider Notes (Signed)
[facility] EMERGENCY DEPARTMENT Provider Note   CSN: [id number] Arrival date & time: 09/21/20  1440     History Chief Complaint  Patient presents with  . Chest Pain    Shawn Todd is a 45 y.o. male.  HPI   Patient with no significant medical history presents to the emergency department with chief complaint of left-sided chest pain.  He endorses pain started approxi-1 and1/2 hours ago, it started spontaneously after he took Percocets, and tramadol for his left shoulder pain (patient had left shoulder surgery in January), he says the chest pain does not radiate, it is below his left nipple, describes as a sharp sensation, it was associated with becoming clammy, feeling lightheaded. He denies feeling short of breath, feeling nauseous or vomiting.  He has no cardiac history, denies recent illicit drug use, he has no history of PEs or DVTs, currently on neurogenic's, he is a active smoker.  Patient states pain is worsening with movement, especially with left arm movement, he denies  paresthesia or weakness in his left arm.  Patient denies alleviating factors.  Patient denies headaches, fevers, chills, shortness of breath, abdominal pain, nausea, vomiting, diarrhea, worsening pedal edema.  Past Medical History:  Diagnosis Date  . Pneumonia   . Synovitis of right shoulder 09/2014  . 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 Tendinosis 09/2014   right shoulder    Patient Active Problem List   Diagnosis Date Noted  . Dermatitis 12/07/2016  . Routine general medical examination at a health care facility 12/07/2016    Past Surgical History:  Procedure Laterality Date  . APPENDECTOMY    . SHOULDER ARTHROSCOPY WITH SUBACROMIAL DECOMPRESSION, ROTATOR CUFF REPAIR AND BICEP TENDON REPAIR Right 10/01/2014   Procedure: RIGHT SHOULDER ARTHROSCOPY DEBRIDEMENT,  ACROMIOPLASTY,  DISTAL CLAVICAL EXCISION, OPEN  BICEPS TENODESIS,SUBACROMIAL DECOMPRESSION,DEBRIDE LABRUM;   Surgeon: Mckinley Jewel, MD;  Location: [facility];  Service: Orthopedics;  Laterality: Right;       Family History  Problem Relation Age of Onset  . Diabetes Father   . Heart disease Father   . Hypertension Father   . Cancer Father   . Hyperlipidemia Father     Social History   Tobacco Use  . Smoking status: Current Every Day Smoker    Packs/day: 0.50    Years: 10.00    Pack years: 5.00    Types: Cigarettes  . Smokeless tobacco: Never Used  Substance Use Topics  . Alcohol use: Yes    Comment: 2-3 x/week  . Drug use: No    Home Medications Prior to Admission medications   Medication Sig Start Date End Date Taking? Authorizing Provider  cyclobenzaprine (FLEXERIL) 10 MG tablet Take 1 tablet (10 mg total) by mouth 2 (two) times daily as needed for muscle spasms. 09/21/20  Yes Carroll Sage, PA-C  ciprofloxacin (CIPRO) 500 MG tablet Take 1 tablet (500 mg total) by mouth 2 (two) times daily. 02/21/18   Noni Saupe, MD  Multiple Vitamin (MULTIVITAMIN WITH MINERALS) TABS tablet Take 1 tablet by mouth daily.    [provider]    Allergies    Patient has no known allergies.  Review of Systems   Review of Systems  Constitutional: Negative for chills and fever.  HENT: Negative for congestion and sore throat.   Respiratory: Negative for shortness of breath.   Cardiovascular: Positive for chest pain.  Gastrointestinal: Negative for abdominal pain, diarrhea, nausea and vomiting.  Genitourinary:  Negative for enuresis and flank pain.  Musculoskeletal: Negative for back pain.  Skin: Negative for rash.  Neurological: Positive for dizziness.  Hematological: Does not bruise/bleed easily.    Physical Exam Updated Vital Signs BP 129/79   Pulse 60   Temp 98.4 F (36.9 C) (Oral)   Resp 19   Ht 6\' 3"  (1.905 m)   Wt 69.4 kg   SpO2 100%   BMI 19.12 kg/m   Physical Exam Vitals and nursing note reviewed.  Constitutional:      General: He  is not in acute distress.    Appearance: He is not ill-appearing.  HENT:     Head: Normocephalic and atraumatic.     Nose: No congestion.  Eyes:     Conjunctiva/sclera: Conjunctivae normal.  Cardiovascular:     Rate and Rhythm: Normal rate and regular rhythm.     Pulses: Normal pulses.     Heart sounds: No murmur heard. No friction rub. No gallop.      Comments: Patient's chest pain is reproducible with palpation along his sternum as well as his third rib midclavicular, there is no crepitus or deformities present.  No flail chest noted Pulmonary:     Effort: No respiratory distress.     Breath sounds: No wheezing, rhonchi or rales.  Abdominal:     Palpations: Abdomen is soft.     Tenderness: There is no abdominal tenderness.  Musculoskeletal:     Right lower leg: No edema.     Left lower leg: No edema.  Skin:    General: Skin is warm and dry.  Neurological:     Mental Status: He is alert.  Psychiatric:        Mood and Affect: Mood normal.     ED Results / Procedures / Treatments   Labs (all labs ordered are listed, but only abnormal results are displayed) Labs Reviewed  COMPREHENSIVE METABOLIC PANEL  CBC WITH DIFFERENTIAL/PLATELET  D-DIMER, QUANTITATIVE  TROPONIN I (HIGH SENSITIVITY)  TROPONIN I (HIGH SENSITIVITY)    EKG EKG Interpretation  Date/Time:  Tuesday September 21 2020 14:58:13 EDT Ventricular Rate:  65 PR Interval:  135 QRS Duration: 96 QT Interval:  431 QTC Calculation: 449 R Axis:   82 Text Interpretation: Sinus rhythm RSR' in V1 or V2, right VCD or RVH Left ventricular hypertrophy ST elev, probable normal early repol pattern No prior ECG for comparison. No STEMI Confirmed by [id number] (Theda Belfast) on 09/21/2020 3:20:26 PM   Radiology DG Chest Port 1 View  Result Date: 09/21/2020 CLINICAL DATA:  Chest pain. EXAM: PORTABLE CHEST 1 VIEW COMPARISON:  Nov 10, 2015. FINDINGS: The heart size and mediastinal contours are within normal limits. Both lungs are  clear. No pneumothorax or pleural effusion is noted. The visualized skeletal structures are unremarkable. IMPRESSION: No active disease. Electronically Signed   By: Nov 12, 2015 M.D.   On: 09/21/2020 15:46    Procedures Procedures   Medications Ordered in ED Medications  cyclobenzaprine (FLEXERIL) tablet 10 mg (10 mg Oral Given 09/21/20 1540)    ED Course  I have reviewed the triage vital signs and the nursing notes.  Pertinent labs & imaging results that were available during my care of the patient were reviewed by me and considered in my medical decision making (see chart for details).    MDM Rules/Calculators/A&P                         Initial impression-patient  presents with chest pain.  He is alert, does not appear in acute distress, vital signs reassuring.  Suspect this is muscular, will obtain chest pain work-up, EKG, chest x-ray, provide patient with Flexeril and reassess.  Work-up-CBC unremarkable, CMP unremarkable, first troponin is 5.  Chest x-ray is negative EKG sinus without signs of ischemia no ST elevation depression noted.   Reassessment patient is reassessed after providing him with muscle relaxers, states he is feeling much better, has no complaints at this time.  Vital signs remained stable.  Updated patient on findings, he is agreeable for discharge at this time.  Rule out- I have low suspicion for ACS as history is atypical, patient has no cardiac history, EKG was sinus rhythm without signs of ischemia, patient had a delta troponin.  Low suspicion for PE as patient denies pleuritic chest pain, shortness of breath, patient denies leg pain, no pedal edema noted on exam, D-dimer is negative. low suspicion for AAA or aortic dissection as history is atypical, patient has low risk factors.  Low suspicion for systemic infection as patient is nontoxic-appearing, vital signs reassuring, no obvious source infection noted on exam.  Plan-suspect patient's pain is muscular as  it improved with muscle relaxers, worsening with arm movement.  Will start patient on muscle relaxers, recommend over-the-counter pain medications follow-up with PCP for further evaluation.  Vital signs have remained stable, no indication for hospital admission.   Patient given at home care as well strict return precautions.  Patient verbalized that they understood agreed to said plan.   Final Clinical Impression(s) / ED Diagnoses Final diagnoses:  Atypical chest pain    Rx / DC Orders ED Discharge Orders         Ordered    cyclobenzaprine (FLEXERIL) 10 MG tablet  2 times daily PRN        09/21/20 1740           Barnie Del 09/21/20 1754    Tegeler, Canary Brim, MD 09/22/20 [phone]

## 2023-02-02 IMAGING — DX DG CHEST 1V PORT
2 series · 2 of 2 positions shown · non-contrast
Comparison: November 10, 2015.

CLINICAL DATA: Chest pain.

EXAM:
PORTABLE CHEST 1 VIEW

[chest ap (1 of 2)]
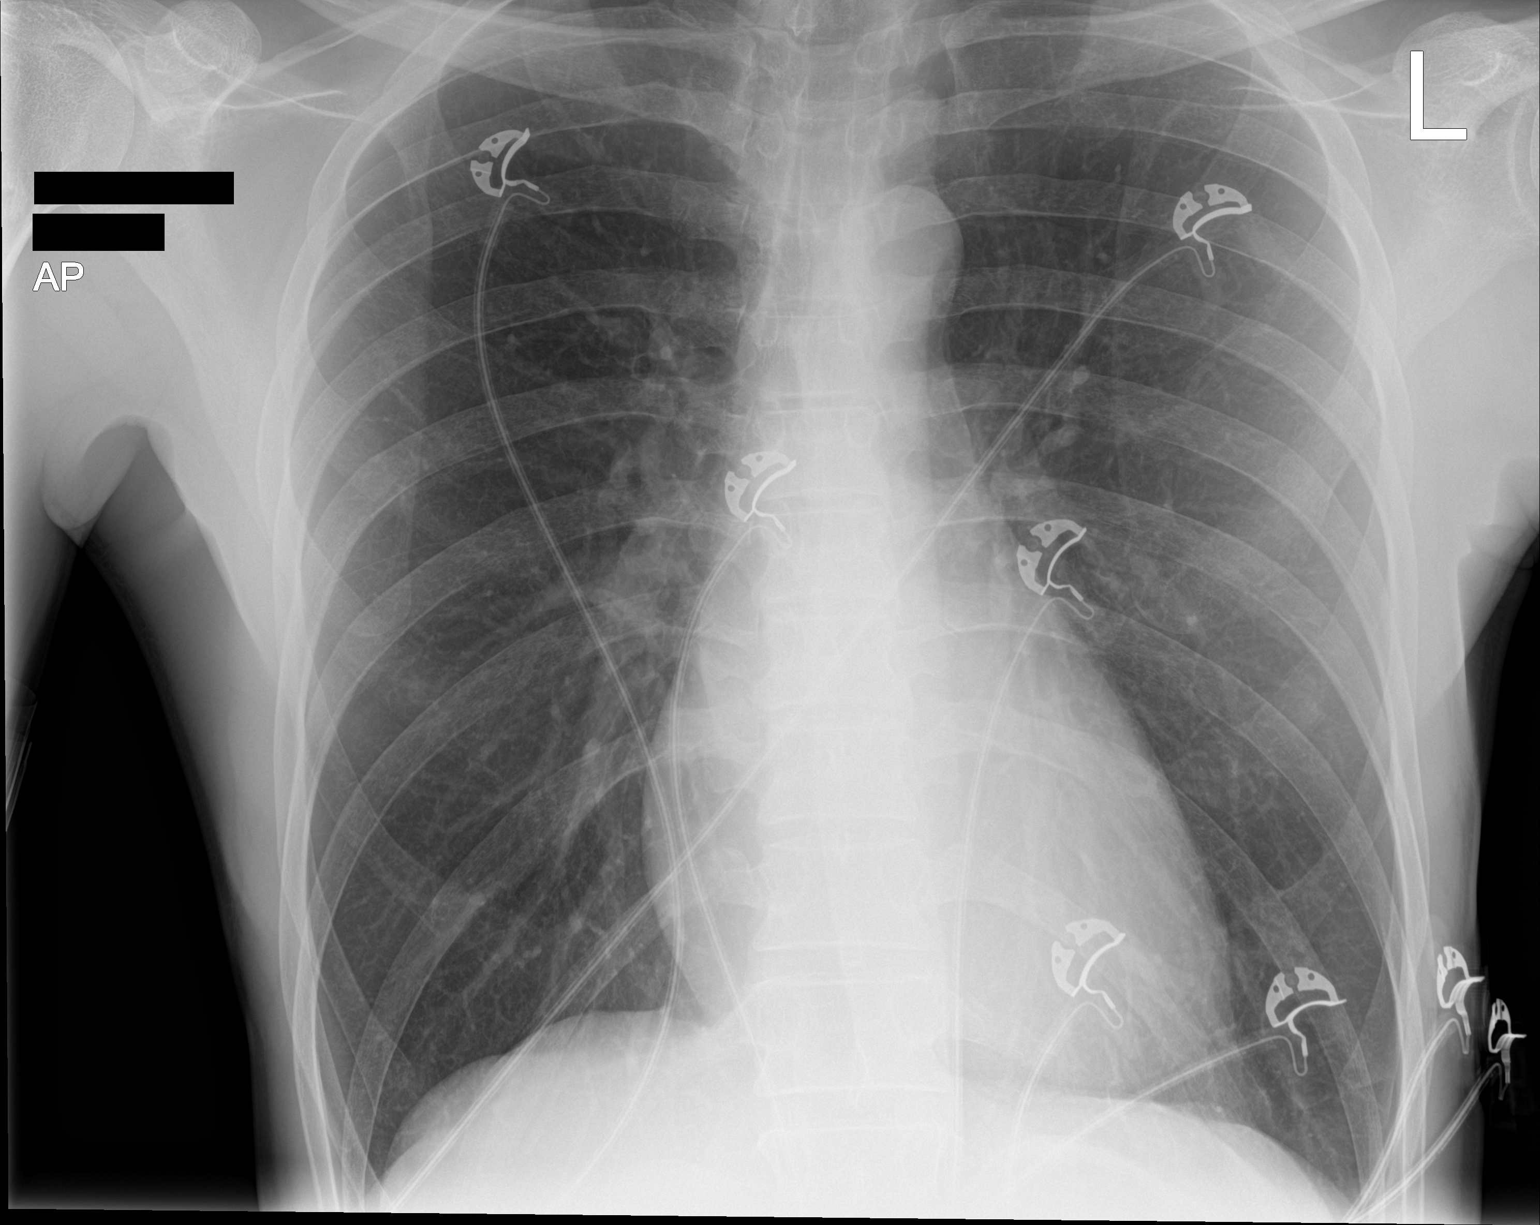

[chest ap (2 of 2)]
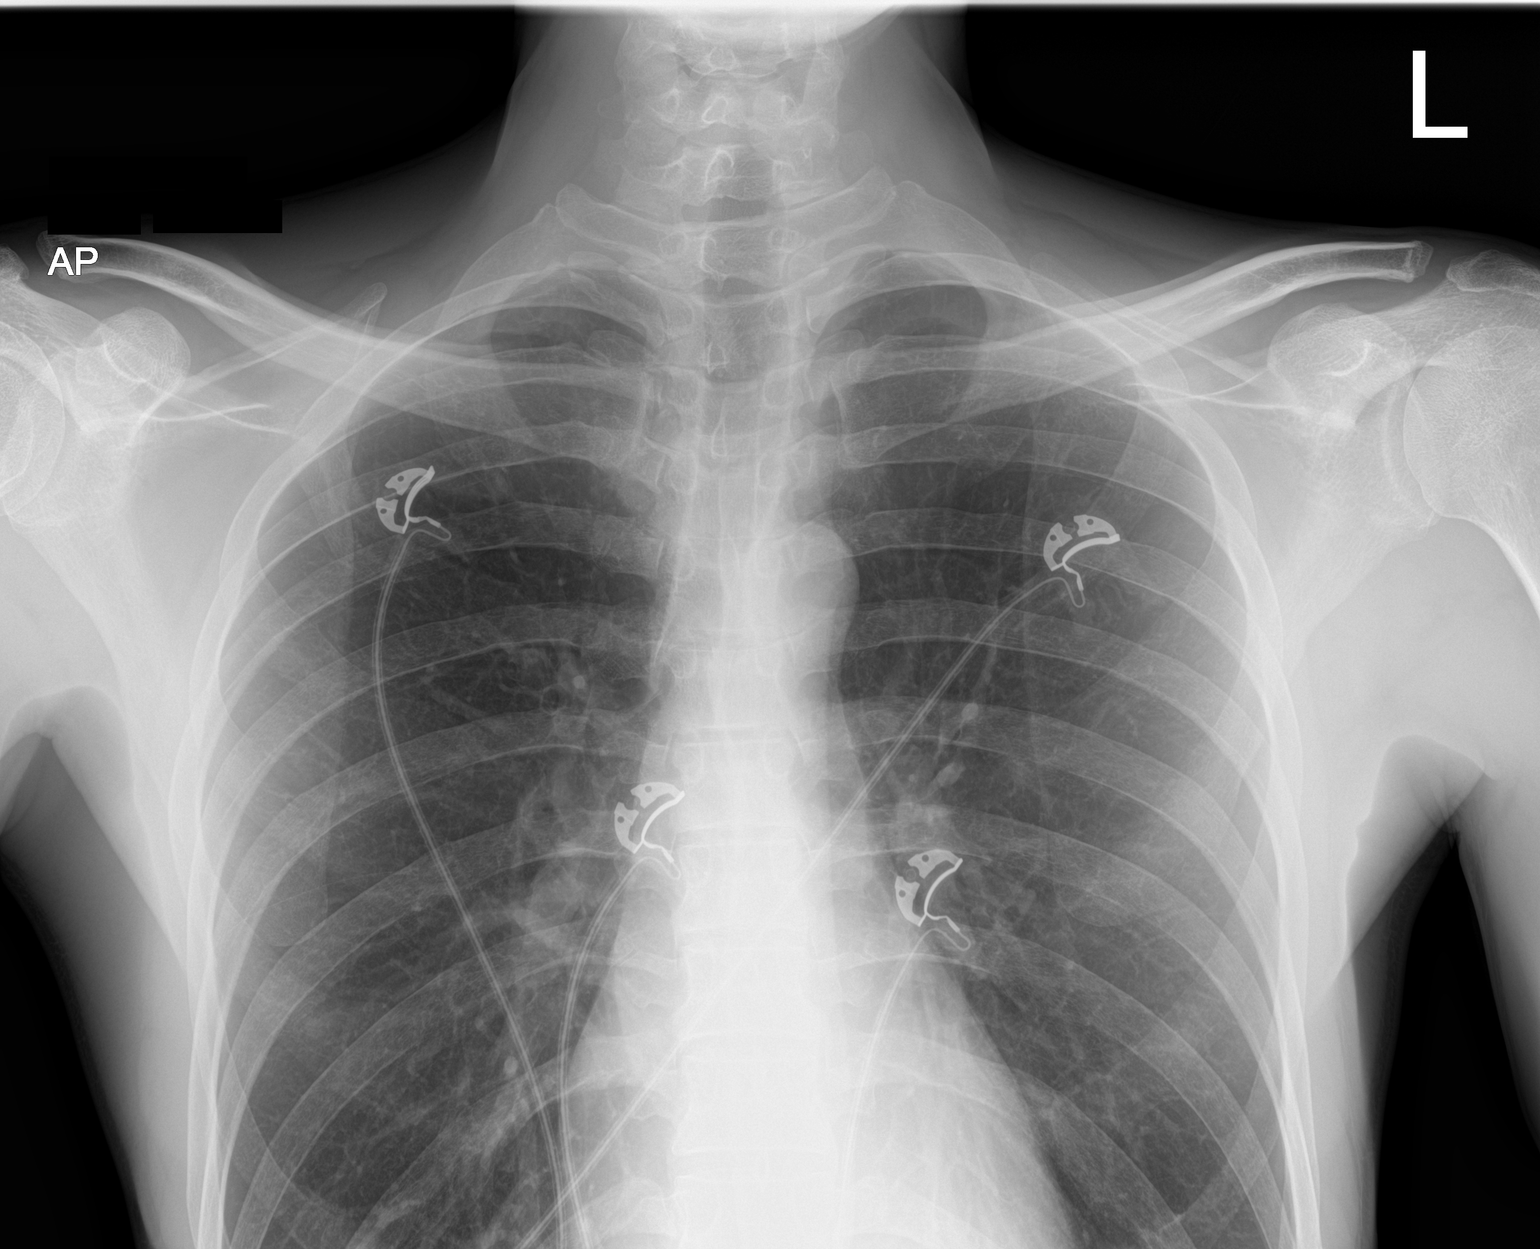

[2 of 2 positions shown; findings below may reference images not displayed]

FINDINGS: The heart size and mediastinal contours are within normal limits.
Both lungs are clear. No pneumothorax or pleural effusion is noted.
The visualized skeletal structures are unremarkable.
IMPRESSION: No active disease.
